# Patient Record
Sex: Female | Born: 1975 | Hispanic: No | Marital: Married | Smoking: Never smoker
Health system: Southern US, Community
[De-identification: ages and names within clinical notes are randomized; demographics above are authoritative.]

## PROBLEM LIST (undated history)

## (undated) DIAGNOSIS — D649 Anemia, unspecified: Secondary | ICD-10-CM

## (undated) DIAGNOSIS — E119 Type 2 diabetes mellitus without complications: Secondary | ICD-10-CM

## (undated) DIAGNOSIS — D219 Benign neoplasm of connective and other soft tissue, unspecified: Secondary | ICD-10-CM

## (undated) DIAGNOSIS — O24419 Gestational diabetes mellitus in pregnancy, unspecified control: Secondary | ICD-10-CM

## (undated) HISTORY — DX: Benign neoplasm of connective and other soft tissue, unspecified: D21.9

## (undated) HISTORY — DX: Gestational diabetes mellitus in pregnancy, unspecified control: O24.419

## (undated) HISTORY — PX: MYOMECTOMY ABDOMINAL APPROACH: SUR870

## (undated) HISTORY — DX: Anemia, unspecified: D64.9

## (undated) HISTORY — PX: EYE SURGERY: SHX253

---

## 2018-08-03 ENCOUNTER — Ambulatory Visit
Admission: EM | Admit: 2018-08-03 | Discharge: 2018-08-03 | Disposition: A | Discharge status: Home / Self Care | Payer: Medicaid Other | Attending: Family Medicine | Admitting: Family Medicine

## 2018-08-03 ENCOUNTER — Other Ambulatory Visit: Payer: Self-pay

## 2018-08-03 DIAGNOSIS — R202 Paresthesia of skin: Secondary | ICD-10-CM | POA: Diagnosis present

## 2018-08-03 DIAGNOSIS — R519 Headache, unspecified: Secondary | ICD-10-CM

## 2018-08-03 DIAGNOSIS — R51 Headache: Secondary | ICD-10-CM | POA: Insufficient documentation

## 2018-08-03 LAB — CBC WITH DIFFERENTIAL/PLATELET
Abs Immature Granulocytes: 0.01 10*3/uL (ref 0.00–0.07)
Basophils Absolute: 0 10*3/uL (ref 0.0–0.1)
Basophils Relative: 1 %
Eosinophils Absolute: 0 10*3/uL (ref 0.0–0.5)
Eosinophils Relative: 1 %
HCT: 38.6 % (ref 36.0–46.0)
Hemoglobin: 13.4 g/dL (ref 12.0–15.0)
Immature Granulocytes: 0 %
Lymphocytes Relative: 47 %
Lymphs Abs: 2 10*3/uL (ref 0.7–4.0)
MCH: 29 pg (ref 26.0–34.0)
MCHC: 34.7 g/dL (ref 30.0–36.0)
MCV: 83.5 fL (ref 80.0–100.0)
Monocytes Absolute: 0.3 10*3/uL (ref 0.1–1.0)
Monocytes Relative: 8 %
Neutro Abs: 1.8 10*3/uL (ref 1.7–7.7)
Neutrophils Relative %: 43 %
Platelets: 210 10*3/uL (ref 150–400)
RBC: 4.62 MIL/uL (ref 3.87–5.11)
RDW: 12.5 % (ref 11.5–15.5)
WBC: 4.2 10*3/uL (ref 4.0–10.5)
nRBC: 0 % (ref 0.0–0.2)

## 2018-08-03 LAB — BASIC METABOLIC PANEL
Anion gap: 6 (ref 5–15)
BUN: 13 mg/dL (ref 6–20)
CO2: 25 mmol/L (ref 22–32)
Calcium: 9.1 mg/dL (ref 8.9–10.3)
Chloride: 105 mmol/L (ref 98–111)
Creatinine, Ser: 0.45 mg/dL (ref 0.44–1.00)
GFR calc Af Amer: >60 mL/min (ref >60)
GFR calc non Af Amer: >60 mL/min (ref >60)
Glucose, Bld: 102 mg/dL — ABNORMAL HIGH (ref 70–99)
Potassium: 3.5 mmol/L (ref 3.5–5.1)
Sodium: 136 mmol/L (ref 135–145)

## 2018-08-03 NOTE — Discharge Instructions (Signed)
Follow up with Primary Care provider 

## 2018-08-03 NOTE — ED Triage Notes (Signed)
Patient complains of headaches, blurry vision and tingling in extremities x 2 weeks.

## 2018-08-03 NOTE — ED Provider Notes (Signed)
[facility]    CSN: [id number] Arrival date & time: 08/03/18  1518     History   Chief Complaint Chief Complaint  Patient presents with  . Headache    HPI Wendy [name] is a 43 y.o. female.   43 yo female with a c/o intermittent headaches, occasional blurred vision and occasional tingling of her extremities (both hands and feet) for the past 2 weeks. Patient currently not having the symptoms as they are intermittent. 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 States she doesn't check her blood sugars and has not had regular follow up with a PCP in over 1 year. Denies chest pain, shortness of breath, one-sided weakness, fevers, chills.    Headache    History reviewed. No pertinent past medical history.  There are no active problems to display for this patient.   Past Surgical History:  Procedure Laterality Date  . CESAREAN SECTION    . EYE SURGERY Bilateral     OB History   No obstetric history on file.      Home Medications    Prior to Admission medications   Medication Sig Start Date End Date Taking? Authorizing Provider  Multiple Vitamins-Minerals (MULTIVITAMIN WITH MINERALS) tablet Take 1 tablet by mouth daily.   Yes [provider]    Family History Family History  Problem Relation Age of Onset  . Diabetes Mother     Social History Social History   Tobacco Use  . Smoking status: Never Smoker  . Smokeless tobacco: Never Used  Substance Use Topics  . Alcohol use: Never    Frequency: Never  . Drug use: Never     Allergies   Patient has no known allergies.   Review of Systems Review of Systems  Neurological: Positive for headaches.     Physical Exam Triage Vital Signs ED Triage Vitals  Enc Vitals Group     BP 08/03/18 1536 105/75     Pulse Rate 08/03/18 1536 76     Resp 08/03/18 1536 16     Temp 08/03/18 1536 99.1 F (37.3 C)     Temp Source  08/03/18 1536 Oral     SpO2 08/03/18 1536 98 %     Weight 08/03/18 1534 105 lb (47.6 kg)     Height 08/03/18 1534 5\' 6"  (1.676 m)     Head Circumference --      Peak Flow --      Pain Score 08/03/18 1534 0     Pain Loc --      Pain Edu? --      Excl. in [place]? --    No data found.  Updated Vital Signs BP 105/75 (BP Location: Right Arm)   Pulse 76   Temp 99.1 F (37.3 C) (Oral)   Resp 16   Ht 5\' 6"  (1.676 m)   Wt 47.6 kg   LMP 07/14/2018   SpO2 98%   BMI 16.95 kg/m   Visual Acuity Right Eye Distance: 20/20(corrected) Left Eye Distance: 20/25(corrected) Bilateral Distance: 20/20(corrected)  Right Eye Near:   Left Eye Near:    Bilateral Near:     Physical Exam Vitals signs and nursing note reviewed.  Constitutional:      General: She is not in acute distress.    Appearance: She is not toxic-appearing or diaphoretic.  Neck:     Musculoskeletal: Neck supple.  Neurological:  General: No focal deficit present.     Mental Status: She is alert and oriented to person, place, and time.     Cranial Nerves: No cranial nerve deficit.      UC Treatments / Results  Labs (all labs ordered are listed, but only abnormal results are displayed) Labs Reviewed  BASIC METABOLIC PANEL - Abnormal; Notable for the following components:      Result Value   Glucose, Bld 102 (*)    All other components within normal limits  CBC WITH DIFFERENTIAL/PLATELET    EKG None  Radiology No results found.  Procedures Procedures (including critical care time)  Medications Ordered in UC Medications - No data to display  Initial Impression / Assessment and Plan / UC Course  I have reviewed the triage vital signs and the nursing notes.  Pertinent labs & imaging results that were available during my care of the patient were reviewed by me and considered in my medical decision making (see chart for details).      Final Clinical Impressions(s) / UC Diagnoses   Final diagnoses:   Frequent headaches  Paresthesias     Discharge Instructions     Follow up with Primary Care provider    ED Prescriptions    None     1. Lab results and diagnosis reviewed with patient 2. rx as per orders above; reviewed possible side effects, interactions, risks and benefits  3. Recommend supportive treatment with  4. Follow-up prn if symptoms worsen or don't improve Controlled Substance Prescriptions [place] Controlled Substance Registry consulted? Not Applicable   Norval Gable, MD 08/03/18 [phone]

## 2018-11-30 ENCOUNTER — Other Ambulatory Visit: Payer: Self-pay

## 2018-11-30 ENCOUNTER — Ambulatory Visit: Payer: Medicaid Other

## 2018-11-30 ENCOUNTER — Ambulatory Visit
Admission: EM | Admit: 2018-11-30 | Discharge: 2018-11-30 | Disposition: A | Discharge status: Home / Self Care | Payer: Medicaid Other | Attending: Family Medicine | Admitting: Family Medicine

## 2018-11-30 ENCOUNTER — Encounter: Payer: Self-pay | Admitting: Emergency Medicine

## 2018-11-30 DIAGNOSIS — L720 Epidermal cyst: Secondary | ICD-10-CM | POA: Insufficient documentation

## 2018-11-30 MED ORDER — MUPIROCIN 2 % EX OINT: 1.0000 "application " | TOPICAL_OINTMENT | Freq: Three times a day (TID) | CUTANEOUS | 0 refills | Status: DC

## 2018-11-30 NOTE — Discharge Instructions (Signed)
Use mupirocin to prevent secondary infection.  Recommend following up with hand surgery possible excision.

## 2018-11-30 NOTE — ED Provider Notes (Signed)
[facility]    CSN: [id number] Arrival date & time: 11/30/18  1735      History   Chief Complaint Chief Complaint  Patient presents with  . knot    right 4th finger    HPI Wendy [name] is a 43 y.o. female.   HPI  43 year old female presents with a tender small lesion over the volar aspect of her right dominant fourth finger at the ulnar base of her middle phalanx.  She relates that about a month ago she had a splinter embedded in the area from a cabinet in her home.  She states she remove the splinter,she thought totally, and seemed to improve until last weekend she noticed a raised area that is very tender to the touch.  States she is been able to express some fluid but does not appear to be any pus present.  The area does not appear infected.  She has had no fever or chills.       History reviewed. No pertinent past medical history.  There are no active problems to display for this patient.   Past Surgical History:  Procedure Laterality Date  . CESAREAN SECTION    . EYE SURGERY Bilateral     OB History   No obstetric history on file.      Home Medications    Prior to Admission medications   Medication Sig Start Date End Date Taking? Authorizing Provider  Multiple Vitamins-Minerals (MULTIVITAMIN WITH MINERALS) tablet Take 1 tablet by mouth daily.   Yes [provider]  mupirocin ointment (BACTROBAN) 2 % Apply 1 application topically 3 (three) times daily. 11/30/18   Lorin Picket, PA-C    Family History Family History  Problem Relation Age of Onset  . Diabetes Mother     Social History Social History   Tobacco Use  . Smoking status: Never Smoker  . Smokeless tobacco: Never Used  Substance Use Topics  . Alcohol use: Never    Frequency: Never  . Drug use: Never     Allergies   Patient has no known allergies.   Review of Systems Review of Systems  Constitutional: Negative for activity change, appetite change, chills,  diaphoresis, fatigue and fever.  Skin: Positive for wound.  All other systems reviewed and are negative.    Physical Exam Triage Vital Signs ED Triage Vitals  Enc Vitals Group     BP 11/30/18 1746 108/74     Pulse Rate 11/30/18 1746 73     Resp 11/30/18 1746 14     Temp 11/30/18 1746 98.4 F (36.9 C)     Temp Source 11/30/18 1746 Oral     SpO2 11/30/18 1746 98 %     Weight 11/30/18 1744 100 lb (45.4 kg)     Height 11/30/18 1744 5\' 6"  (1.676 m)     Head Circumference --      Peak Flow --      Pain Score 11/30/18 1744 7     Pain Loc --      Pain Edu? --      Excl. in [place]? --    No data found.  Updated Vital Signs BP 108/74 (BP Location: Left Arm)   Pulse 73   Temp 98.4 F (36.9 C) (Oral)   Resp 14   Ht 5\' 6"  (1.676 m)   Wt 100 lb (45.4 kg)   LMP 11/26/2018 (Exact Date)   SpO2 98%   BMI 16.14 kg/m   Visual Acuity Right  Eye Distance:   Left Eye Distance:   Bilateral Distance:    Right Eye Near:   Left Eye Near:    Bilateral Near:     Physical Exam Vitals signs and nursing note reviewed.  Constitutional:      General: She is not in acute distress.    Appearance: Normal appearance. She is normal weight. She is not ill-appearing, toxic-appearing or diaphoretic.  HENT:     Head: Normocephalic.     Nose: Nose normal.  Eyes:     Conjunctiva/sclera: Conjunctivae normal.     Pupils: Pupils are equal, round, and reactive to light.  Neck:     Musculoskeletal: Normal range of motion and neck supple.  Cardiovascular:     Rate and Rhythm: Normal rate and regular rhythm.  Pulmonary:     Effort: Pulmonary effort is normal.     Breath sounds: Normal breath sounds.  Musculoskeletal: Normal range of motion.        General: Tenderness present.     Comments: Examination of the right fourth dominant finger shows a small 3 mm in diameter circular hard callus type lesion with a central punctate area.  No erythema.  The lesion is tender to the touch.  Induration extends  another 1 to 2 mm around the darkened cornified lesion.  Nothing it was able to be expressed from the lesion.  Skin:    General: Skin is warm and dry.     Findings: Lesion present.  Neurological:     General: No focal deficit present.     Mental Status: She is alert and oriented to person, place, and time.  Psychiatric:        Mood and Affect: Mood normal.        Behavior: Behavior normal.      UC Treatments / Results  Labs (all labs ordered are listed, but only abnormal results are displayed) Labs Reviewed - No data to display  EKG   Radiology Dg Finger Ring Right  Result Date: 11/30/2018 CLINICAL DATA:  Foreign body, splinter near PIP joint EXAM: RIGHT RING FINGER 2+V COMPARISON:  None. FINDINGS: No fracture or dislocation of the right fourth digit. Joint spaces are well preserved. There is no radiopaque foreign body visible. Soft tissue edema of the volar fourth digit overlying the middle phalanx. IMPRESSION: No fracture or dislocation of the right fourth digit. Joint spaces are well preserved. There is no radiopaque foreign body visible. Soft tissue edema of the volar fourth digit overlying the middle phalanx. Please note that organic foreign foreign bodies such as wood splinters are typically not radiopaque. Electronically Signed   By: Eddie Candle M.D.   On: 11/30/2018 18:27    Procedures Foreign Body Removal  Date/Time: 11/30/2018 7:00 PM Performed by: Lorin Picket, PA-C Authorized by: Coral Spikes, DO   Consent:    Consent obtained:  Verbal   Consent given by:  Patient   Risks discussed:  Infection and incomplete removal Location:    Location:  Finger   Finger location:  R ring finger   Depth:  Intradermal   Tendon involvement:  None Pre-procedure details:    Imaging:  X-ray   Neurovascular status: intact   Anesthesia (see MAR for exact dosages):    Anesthesia method:  None Procedure type:    Procedure complexity:  Simple Procedure details:     Dissection of underlying tissues: no     Bloodless field: no     Foreign bodies recovered:  None   Intact foreign body removal: no   Post-procedure details:    Neurovascular status: intact     Confirmation:  No additional foreign bodies on visualization   Skin closure:  None   Dressing:  Antibiotic ointment   Patient tolerance of procedure:  Tolerated well, no immediate complications Comments:     18-gauge needle was used to tease and probe the punctate area.  No additional foreign body was identified.  She tolerated the procedure well.  Will refer to hand surgery for further evaluation and possible excision.  She was given mupirocin ointment to apply 3 times daily to prevent secondary infection.   (including critical care time)  Medications Ordered in UC Medications - No data to display  Initial Impression / Assessment and Plan / UC Course  I have reviewed the triage vital signs and the nursing notes.  Pertinent labs & imaging results that were available during my care of the patient were reviewed by me and considered in my medical decision making (see chart for details).   Patient likely has an inclusion cyst from previous, to the area from a splinter.  Did not reveal a foreign body this was likely from organic material and not likely radiopaque.  I therefore recommended that she follow-up with hand surgery for further evaluation and possible excision.  Patient was given a prescription for mupirocin to prevent secondary infection.  Currently in training at [facility] and will find it difficult to make an appointment in the near future.  I recommended that she consider a orthopedic urgent care where she may go after hours.   Final Clinical Impressions(s) / UC Diagnoses   Final diagnoses:  Epidermal inclusion cyst     Discharge Instructions     Use mupirocin to prevent secondary infection.  Recommend following up with hand surgery possible excision.    ED Prescriptions     Medication Sig Dispense Auth. Provider   mupirocin ointment (BACTROBAN) 2 % Apply 1 application topically 3 (three) times daily. 22 g Lorin Picket, PA-C     PDMP not reviewed this encounter.   Lorin Picket, PA-C 11/30/18 1907

## 2018-11-30 NOTE — ED Triage Notes (Signed)
Patient c/o tender bump on her right 4th finger for the past week.  Patient states that she had cut the same finger about a month ago.

## 2019-07-06 ENCOUNTER — Ambulatory Visit: Payer: Medicaid Other | Attending: Internal Medicine

## 2019-07-06 DIAGNOSIS — Z23 Encounter for immunization: Secondary | ICD-10-CM

## 2019-07-06 NOTE — Progress Notes (Signed)
   Covid-19 Vaccination Clinic  Name:  Wendy [name]    MRN: [id number] DOB: [birth year]  07/06/2019  [name] was observed post Covid-19 immunization for 15 minutes without incident. She was provided with Vaccine Information Sheet and instruction to access the V-Safe system.   [name] was instructed to call 911 with any severe reactions post vaccine: Marland Kitchen Difficulty breathing  . Swelling of face and throat  . A fast heartbeat  . A bad rash all over body  . Dizziness and weakness   Immunizations Administered    Name Date Dose VIS Date Route   Pfizer COVID-19 Vaccine 07/06/2019  9:34 AM 0.3 mL 05/08/2018 Intramuscular   Manufacturer: [place]   Lot: [id number]   [place]: [id number]

## 2019-07-30 ENCOUNTER — Ambulatory Visit: Payer: Medicaid Other

## 2019-08-06 ENCOUNTER — Other Ambulatory Visit: Payer: Self-pay

## 2019-08-06 ENCOUNTER — Emergency Department
Admission: EM | Admit: 2019-08-06 | Discharge: 2019-08-06 | Disposition: A | Discharge status: Home / Self Care | Payer: Managed Care, Other (non HMO) | Attending: Emergency Medicine | Admitting: Emergency Medicine

## 2019-08-06 ENCOUNTER — Encounter: Payer: Self-pay | Admitting: Emergency Medicine

## 2019-08-06 ENCOUNTER — Emergency Department: Payer: Managed Care, Other (non HMO)

## 2019-08-06 DIAGNOSIS — N939 Abnormal uterine and vaginal bleeding, unspecified: Secondary | ICD-10-CM

## 2019-08-06 DIAGNOSIS — O209 Hemorrhage in early pregnancy, unspecified: Secondary | ICD-10-CM | POA: Insufficient documentation

## 2019-08-06 DIAGNOSIS — R1030 Lower abdominal pain, unspecified: Secondary | ICD-10-CM | POA: Insufficient documentation

## 2019-08-06 DIAGNOSIS — Z3A01 Less than 8 weeks gestation of pregnancy: Secondary | ICD-10-CM | POA: Insufficient documentation

## 2019-08-06 DIAGNOSIS — O200 Threatened abortion: Secondary | ICD-10-CM | POA: Insufficient documentation

## 2019-08-06 DIAGNOSIS — O4690 Antepartum hemorrhage, unspecified, unspecified trimester: Secondary | ICD-10-CM

## 2019-08-06 LAB — CBC
HCT: 37.6 % (ref 36.0–46.0)
Hemoglobin: 12.4 g/dL (ref 12.0–15.0)
MCH: 27.9 pg (ref 26.0–34.0)
MCHC: 33 g/dL (ref 30.0–36.0)
MCV: 84.7 fL (ref 80.0–100.0)
Platelets: 229 10*3/uL (ref 150–400)
RBC: 4.44 MIL/uL (ref 3.87–5.11)
RDW: 12.8 % (ref 11.5–15.5)
WBC: 6 10*3/uL (ref 4.0–10.5)
nRBC: 0 % (ref 0.0–0.2)

## 2019-08-06 LAB — HCG, QUANTITATIVE, PREGNANCY: hCG, Beta Chain, Quant, S: 24496 m[IU]/mL — ABNORMAL HIGH (ref <5)

## 2019-08-06 NOTE — ED Notes (Signed)
US at bedside

## 2019-08-06 NOTE — ED Triage Notes (Signed)
Pt reports she is approx. [redacted] weeks pregnant and noticed cramping on Monday and today passed 1 blood clot and has had vaginal bleeding since. Pt has not seen OB for this pregnancy due to recent move. Pt has had previous miscarriage in past.

## 2019-08-06 NOTE — Discharge Instructions (Addendum)
You should return to the ED in 10-14 days for repeat ultrasound. Return sooner if needed.

## 2019-08-06 NOTE — ED Provider Notes (Signed)
[facility] Emergency Department Provider Note ____________________________________________  Time seen: 2100  I have reviewed the triage vital signs and the nursing notes.  HISTORY  Chief Complaint  Vaginal Bleeding  HPI Mina Batdorf is a 44 y.o. female presents herself to the ED at [redacted] weeks gestation, with noted cramping and vaginal bleeding x1 day.  Patient describes  onset of abdominal cramping on Monday.  She has not establish care with a local primary OB provider since relocating to the area.  She does give a history of previous miscarriage.  Patient denies any fevers, chills, sweats, nausea, vomiting, or dizziness.  Patient also denies any chest pain, shortness of breath, or weakness.   History reviewed. No pertinent past medical history.  There are no problems to display for this patient.   Past Surgical History:  Procedure Laterality Date  . CESAREAN SECTION    . EYE SURGERY Bilateral     Prior to Admission medications   Medication Sig Start Date End Date Taking? Authorizing Provider  Multiple Vitamins-Minerals (MULTIVITAMIN WITH MINERALS) tablet Take 1 tablet by mouth daily.    [provider]  mupirocin ointment (BACTROBAN) 2 % Apply 1 application topically 3 (three) times daily. 11/30/18   Lorin Picket, PA-C    Allergies Patient has no known allergies.  Family History  Problem Relation Age of Onset  . Diabetes Mother     Social History Social History   Tobacco Use  . Smoking status: Never Smoker  . Smokeless tobacco: Never Used  Substance Use Topics  . Alcohol use: Never  . Drug use: Never    Review of Systems  Constitutional: Negative for fever. Eyes: Negative for visual changes. ENT: Negative for sore throat. Cardiovascular: Negative for chest pain. Respiratory: Negative for shortness of breath. Gastrointestinal: Negative for abdominal pain, vomiting and diarrhea. Genitourinary: Negative for dysuria. Reports  vaginal bleeding Musculoskeletal: Negative for back pain. Skin: Negative for rash. Neurological: Negative for headaches, focal weakness or numbness. ____________________________________________  PHYSICAL EXAM:  VITAL SIGNS: ED Triage Vitals [08/06/19 1923]  Enc Vitals Group     BP 111/71     Pulse Rate 70     Resp 18     Temp 98.6 F (37 C)     Temp Source Oral     SpO2 100 %     Weight 99 lb (44.9 kg)     Height 5\' 6"  (1.676 m)     Head Circumference      Peak Flow      Pain Score      Pain Loc      Pain Edu?      Excl. in [place]?     Constitutional: Alert and oriented. Well appearing and in no distress. Head: Normocephalic and atraumatic. Eyes: Conjunctivae are normal. Normal extraocular movements Cardiovascular: Normal rate, regular rhythm. Normal distal pulses. Respiratory: Normal respiratory effort. No wheezes/rales/rhonchi. Gastrointestinal: Soft and nontender. No distention. GU: normal external genitalia. Minimal dark blood and small clots in the vault. No CMT or adnexal masses appreciated. Musculoskeletal: Nontender with normal range of motion in all extremities.  Neurologic:  Normal gait without ataxia. Normal speech and language. No gross focal neurologic deficits are appreciated. Skin:  Skin is warm, dry and intact. No rash noted. Psychiatric: Mood and affect are normal. Patient exhibits appropriate insight and judgment. ____________________________________________   LABS (pertinent positives/negatives) Labs Reviewed  WET PREP, GENITAL - Abnormal; Notable for the following components:      Result Value  WBC, Wet Prep HPF POC MODERATE (*)    All other components within normal limits  HCG, QUANTITATIVE, PREGNANCY - Abnormal; Notable for the following components:   hCG, Beta Chain, Quant, S 24,496 (*)    All other components within normal limits  CHLAMYDIA/NGC RT PCR ([facility] ONLY)  CBC  ____________________________________________    RADIOLOGY  OB/Transvaginal US <14 weeks  IMPRESSION: 1. Irregular fluid collection within the uterus presumably representing gestational sac. Mean sac diameter of 18 mm but no yolk sac or embryo. Findings are suspicious but not yet definitive for failed pregnancy. Recommend follow-up US in 10-14 days for definitive diagnosis. This recommendation follows SRU consensus guidelines: Diagnostic Criteria for Nonviable Pregnancy Early in the First Trimester. [place] [place] Med 2013; [id number]. ____________________________________________  PROCEDURES  Procedures ____________________________________________  INITIAL IMPRESSION / ASSESSMENT AND PLAN / ED COURSE  DDX: threatened AB, spontaneous AB, normal IUP, ectopic pregnancy, subchorionic hemorrhage  Female patient with ED evaluation of recent pregnancy test and recent onset of vaginal bleeding.  Patient with a history of multiple miscarriages, presents for concern over possible failed pregnancy.  Labs are reassuring at this time.  Ultrasound does reveal a gestational sac, but no visible fetal pole or cardiac tibias appreciated.  The ultrasound findings are concerning for a failed pregnancy versus an early undefined pregnancy at this time.  Patient is discharged with instructions to return to the ED in 10 to 14 days, for repeat ultrasound.  She is also advised to establish care with a local OB provider in the interim.  Return precautions have been reviewed.  Toshika Piercey was evaluated in Emergency Department on 08/07/2019 for the symptoms described in the history of present illness. She was evaluated in the context of the global COVID-19 pandemic, which necessitated consideration that the patient might be at risk for infection with the SARS-CoV-2 virus that causes COVID-19. Institutional protocols and algorithms that pertain to the evaluation of patients at risk for COVID-19 are in a state of rapid change based on information released by regulatory  bodies including the CDC and federal and state organizations. These policies and algorithms were followed during the patient's care in the ED. ____________________________________________  FINAL CLINICAL IMPRESSION(S) / ED DIAGNOSES  Final diagnoses:  Vaginal bleeding  Threatened miscarriage      Melvenia Needles, PA-C 08/07/19 1544    Blake Divine, MD 08/09/19 [phone]

## 2019-08-07 LAB — WET PREP, GENITAL
Clue Cells Wet Prep HPF POC: NONE SEEN
Sperm: NONE SEEN
Trich, Wet Prep: NONE SEEN
Yeast Wet Prep HPF POC: NONE SEEN

## 2019-08-07 LAB — CHLAMYDIA/NGC RT PCR (ARMC ONLY)
Chlamydia Tr: NOT DETECTED
N gonorrhoeae: NOT DETECTED

## 2019-08-11 ENCOUNTER — Other Ambulatory Visit: Payer: Self-pay

## 2019-08-11 ENCOUNTER — Emergency Department
Admission: EM | Admit: 2019-08-11 | Discharge: 2019-08-11 | Disposition: A | Discharge status: Home / Self Care | Payer: Managed Care, Other (non HMO) | Attending: Obstetrics & Gynecology | Admitting: Obstetrics & Gynecology

## 2019-08-11 ENCOUNTER — Other Ambulatory Visit: Payer: Self-pay | Admitting: Obstetrics & Gynecology

## 2019-08-11 ENCOUNTER — Emergency Department: Payer: Managed Care, Other (non HMO)

## 2019-08-11 DIAGNOSIS — O034 Incomplete spontaneous abortion without complication: Secondary | ICD-10-CM | POA: Insufficient documentation

## 2019-08-11 DIAGNOSIS — R102 Pelvic and perineal pain: Secondary | ICD-10-CM

## 2019-08-11 DIAGNOSIS — Z20822 Contact with and (suspected) exposure to covid-19: Secondary | ICD-10-CM | POA: Diagnosis not present

## 2019-08-11 DIAGNOSIS — Z79899 Other long term (current) drug therapy: Secondary | ICD-10-CM | POA: Diagnosis not present

## 2019-08-11 DIAGNOSIS — O209 Hemorrhage in early pregnancy, unspecified: Secondary | ICD-10-CM | POA: Diagnosis present

## 2019-08-11 DIAGNOSIS — Z3A01 Less than 8 weeks gestation of pregnancy: Secondary | ICD-10-CM | POA: Diagnosis not present

## 2019-08-11 LAB — CBC WITH DIFFERENTIAL/PLATELET
Abs Immature Granulocytes: 0.04 10*3/uL (ref 0.00–0.07)
Basophils Absolute: 0 10*3/uL (ref 0.0–0.1)
Basophils Relative: 0 %
Eosinophils Absolute: 0 10*3/uL (ref 0.0–0.5)
Eosinophils Relative: 0 %
HCT: 32.2 % — ABNORMAL LOW (ref 36.0–46.0)
Hemoglobin: 11.1 g/dL — ABNORMAL LOW (ref 12.0–15.0)
Immature Granulocytes: 1 %
Lymphocytes Relative: 11 %
Lymphs Abs: 0.9 10*3/uL (ref 0.7–4.0)
MCH: 28.4 pg (ref 26.0–34.0)
MCHC: 34.5 g/dL (ref 30.0–36.0)
MCV: 82.4 fL (ref 80.0–100.0)
Monocytes Absolute: 0.5 10*3/uL (ref 0.1–1.0)
Monocytes Relative: 6 %
Neutro Abs: 6.6 10*3/uL (ref 1.7–7.7)
Neutrophils Relative %: 82 %
Platelets: 197 10*3/uL (ref 150–400)
RBC: 3.91 MIL/uL (ref 3.87–5.11)
RDW: 12.8 % (ref 11.5–15.5)
WBC: 8.1 10*3/uL (ref 4.0–10.5)
nRBC: 0 % (ref 0.0–0.2)

## 2019-08-11 LAB — BASIC METABOLIC PANEL
Anion gap: 6 (ref 5–15)
BUN: 11 mg/dL (ref 6–20)
CO2: 26 mmol/L (ref 22–32)
Calcium: 8.6 mg/dL — ABNORMAL LOW (ref 8.9–10.3)
Chloride: 108 mmol/L (ref 98–111)
Creatinine, Ser: 0.41 mg/dL — ABNORMAL LOW (ref 0.44–1.00)
GFR calc Af Amer: >60 mL/min (ref >60)
GFR calc non Af Amer: >60 mL/min (ref >60)
Glucose, Bld: 119 mg/dL — ABNORMAL HIGH (ref 70–99)
Potassium: 3.7 mmol/L (ref 3.5–5.1)
Sodium: 140 mmol/L (ref 135–145)

## 2019-08-11 LAB — ABO/RH: ABO/RH(D): A POS

## 2019-08-11 LAB — HCG, QUANTITATIVE, PREGNANCY: hCG, Beta Chain, Quant, S: 9711 m[IU]/mL — ABNORMAL HIGH (ref <5)

## 2019-08-11 LAB — SARS CORONAVIRUS 2 BY RT PCR (HOSPITAL ORDER, PERFORMED IN ~~LOC~~ HOSPITAL LAB): SARS Coronavirus 2: NEGATIVE

## 2019-08-11 MED ORDER — IBUPROFEN 600 MG PO TABS
600.0000 mg | ORAL_TABLET | Freq: Four times a day (QID) | ORAL | 3 refills | Status: DC | PRN
Start: 2019-08-11 — End: 2019-12-17

## 2019-08-11 MED ORDER — METHYLERGONOVINE MALEATE 0.2 MG PO TABS
0.2000 mg | ORAL_TABLET | Freq: Four times a day (QID) | ORAL | Status: DC
  Administered 2019-08-11: 0.2 mg via ORAL
  Filled 2019-08-11 (×3): qty 1

## 2019-08-11 MED ORDER — LACTATED RINGERS IV BOLUS
1000.0000 mL | Freq: Once | INTRAVENOUS | Status: AC
  Administered 2019-08-11: 1000 mL via INTRAVENOUS

## 2019-08-11 MED ORDER — DOXYCYCLINE HYCLATE 100 MG PO CAPS
100.0000 mg | ORAL_CAPSULE | Freq: Two times a day (BID) | ORAL | 0 refills | Status: DC
Start: 2019-08-11 — End: 2019-08-23

## 2019-08-11 MED ORDER — IBUPROFEN 400 MG PO TABS
400.0000 mg | ORAL_TABLET | Freq: Four times a day (QID) | ORAL | Status: DC | PRN
  Administered 2019-08-11: 400 mg via ORAL
  Filled 2019-08-11: qty 1

## 2019-08-11 MED ORDER — METHYLERGONOVINE MALEATE 0.2 MG PO TABS
0.2000 mg | ORAL_TABLET | Freq: Four times a day (QID) | ORAL | 0 refills | Status: DC
Start: 2019-08-11 — End: 2019-08-23

## 2019-08-11 MED ORDER — DOXYCYCLINE HYCLATE 100 MG PO TABS
100.0000 mg | ORAL_TABLET | Freq: Two times a day (BID) | ORAL | Status: DC
  Administered 2019-08-11: 100 mg via ORAL
  Filled 2019-08-11: qty 1

## 2019-08-11 MED ORDER — MORPHINE SULFATE (PF) 4 MG/ML IV SOLN
4.0000 mg | Freq: Once | INTRAVENOUS | Status: AC
  Administered 2019-08-11: 4 mg via INTRAVENOUS
  Filled 2019-08-11: qty 1

## 2019-08-11 NOTE — ED Triage Notes (Signed)
Pt arrives via EMS from home for vaginal bleeding after recently being told she was pregnant from a urine test at her doctors- pt states bleeding and cramping has been going on for about a week- pt has soaked 3-4 pads today and has noticed multiple clots

## 2019-08-11 NOTE — ED Notes (Signed)
Dr Charna Archer at bedside to do pelvic and states there is too much blood to do wet prep and GC/chlamydia swab

## 2019-08-11 NOTE — ED Notes (Signed)
Pt transported for US

## 2019-08-11 NOTE — Consult Note (Signed)
Obstetrics & Gynecology History and Physical Note  Date of Consultation: 08/11/2019   Requesting Provider: [facility] ER  Primary OBGYN: none Primary Care Provider: Patient, No Pcp Per  CC/ Reason for Consultation: Bleeding in early pregnancy  History of Present Illness: Wendy [name] is a 44 y.o. G3P1011 AAF (Patient's last menstrual period was 05/27/2019.), with the above CC. She noted bleeding several days ago and was seen with beta hCG 24,000 and [place] c/w fetal demise.  Worse bleeding yesterday and today with severe crampy lower abdominal pain without radiation and no modifiers or associated sx's.  Beta m=now 9000.  [place] with gest sac in LUS.  ROS: A review of systems was performed and was complete and comprehensive, except as stated in the above HPI.  OBGYN History: As per HPI. OB History    Gravida  1   Para      Term      Preterm      AB      Living        SAB      TAB      Ectopic      Multiple      Live Births               Past Medical History: History reviewed. No pertinent past medical history.  H/o FIBROIDS Prior Myomectomy  Past Surgical History: Past Surgical History:  Procedure Laterality Date  . CESAREAN SECTION    . EYE SURGERY Bilateral     Family History:  Family History  Problem Relation Age of Onset  . Diabetes Mother    She denies any female cancers, bleeding or blood clotting disorders.   Social History:  Social History   Socioeconomic History  . Marital status: Married    Spouse name: Not on file  . Number of children: Not on file  . Years of education: Not on file  . Highest education level: Not on file  Occupational History  . Not on file  Tobacco Use  . Smoking status: Never Smoker  . Smokeless tobacco: Never Used  Substance and Sexual Activity  . Alcohol use: Never  . Drug use: Never  . Sexual activity: Not on file  Other Topics Concern  . Not on file  Social History Narrative  . Not on file   Social Determinants of  Health   Financial Resource Strain:   . Difficulty of Paying Living Expenses:   Food Insecurity:   . Worried About Charity fundraiser in the Last Year:   . Arboriculturist in the Last Year:   Transportation Needs:   . Film/video editor (Medical):   Marland Kitchen Lack of Transportation (Non-Medical):   Physical Activity:   . Days of Exercise per Week:   . Minutes of Exercise per Session:   Stress:   . Feeling of Stress :   Social Connections:   . Frequency of Communication with Friends and Family:   . Frequency of Social Gatherings with Friends and Family:   . Attends Religious Services:   . Active Member of Clubs or Organizations:   . Attends Archivist Meetings:   Marland Kitchen Marital Status:   Intimate Partner Violence:   . Fear of Current or Ex-Partner:   . Emotionally Abused:   Marland Kitchen Physically Abused:   . Sexually Abused:     Allergy: No Known Allergies  Current Outpatient Medications: (Not in a hospital admission)   Hospital Medications: Current Facility-Administered Medications  Medication Dose Route Frequency Provider Last Rate Last Admin  . doxycycline (VIBRA-TABS) tablet 100 mg  100 mg Oral Q12H Gae Dry, MD      . ibuprofen (ADVIL) tablet 400 mg  400 mg Oral Q6H PRN Gae Dry, MD      . methylergonovine (METHERGINE) tablet 0.2 mg  0.2 mg Oral QID Gae Dry, MD       Current Outpatient Medications  Medication Sig Dispense Refill  . Multiple Vitamins-Minerals (MULTIVITAMIN WITH MINERALS) tablet Take 1 tablet by mouth daily.    . mupirocin ointment (BACTROBAN) 2 % Apply 1 application topically 3 (three) times daily. 22 g 0    Physical Exam: Vitals:   08/11/19 1200 08/11/19 1230 08/11/19 1300 08/11/19 1330  BP: 95/78 91/66 92/60  92/63  Pulse: 80 63 63 60  Resp:      SpO2: 99% 100% 100% 100%  Weight:      Height:        Pulse Rate:  [60-85] 60 (05/30 1330) Resp:  [18] 18 (05/30 1125) BP: (86-95)/(59-78) 92/63 (05/30 1330) SpO2:  [97  %-100 %] 100 % (05/30 1330) Weight:  [45 kg] 45 kg (05/30 1122) No intake/output data recorded. Total I/O In: 1000 [IV [id number] Out: -   Intake/Output Summary (Last 24 hours) at 08/11/2019 1355 Last data filed at 08/11/2019 1353 Gross per 24 hour  Intake 1000 ml  Output --  Net 1000 ml    Body mass index is 16.01 kg/m. Constitutional: Well nourished, well developed female in no acute distress.  HEENT: normal Neck:  Supple, normal appearance, and no thyromegaly  Cardiovascular:Regular rate and rhythm.  No murmurs, rubs or gallops. Respiratory:  Clear to auscultation bilateral. Normal respiratory effort Abdomen: positive bowel sounds and no masses, hernias; diffusely non tender to palpation, non distended Neuro: grossly intact Psych:  Normal mood and affect.  Skin:  Warm and dry.  MS: normal gait and normal bilateral lower extremity strength/ROM/symmetry Lymphatic:  No inguinal lymphadenopathy.   Pelvic exam: is not limited by body habitus EGBUS: within normal limits Vagina: within normal limits. Bladder and Urethra: normal. Cervix: somewhat dilated with clot at os Uterus:  enlarged, 8 weeks size Adnexa: no mass, fullness, tenderness  Laboratory: Beta HCG: 9000  Recent Labs  Lab 08/06/19 1926 08/11/19 1126  WBC 6.0 8.1  HGB 12.4 11.1*  HCT 37.6 32.2*  PLT 229 197   Recent Labs  Lab 08/11/19 1126  NA 140  K 3.7  CL 108  CO2 26  BUN 11  CREATININE 0.41*  CALCIUM 8.6*  GLUCOSE 119*   No results for input(s): APTT, INR, PTT in the last 168 hours.  Invalid input(s): DRHAPTT Recent Labs  Lab 08/11/19 1126  [place] Performed at [facility], [address], [place], [place] [address]     Imaging:  Ultrasound independently reviewed/interpreted by self.  Assessment: Wendy [name] is a 44 y.o. [phone] (Patient's last menstrual period was 05/27/2019.) who presented to the ED with complaints of pain and bleeding; findings are consistent with  Incomplete abortion.  Plan Exam in room.  Tissue forceps used to extract tissue thru cervix.  Seems intact and complete.  Min bleeding afterwards  Will monitor and provide Methergine for uterine effects.    Rx Methergine for 24 hours advised ABX for 3 days (Doxycyline) IBF for pain Tissue to pathology  F/U and contraception mgt as outpatient.  A total of 50 minutes were spent face-to-face with the patient  as well as preparation, review, communication, and documentation during this encounter.   Barnett Applebaum, MD, [facility], [facility] 08/11/2019  1:55 PM Pager [phone]

## 2019-08-11 NOTE — ED Provider Notes (Signed)
[facility] Emergency Department Provider Note   ____________________________________________   First MD Initiated Contact with Patient 08/11/19 1115     (approximate)  I have reviewed the triage vital signs and the nursing notes.   HISTORY  Chief Complaint Vaginal Bleeding    HPI Wendy [name] is a 44 y.o. female G3P1011 at approximately 8 weeks of pregnancy with no significant past medical history who presents to the ED complaining of abdominal pain and vaginal bleeding.  Patient reports she has been dealing with light vaginal bleeding for about 1 week, was initially evaluated for this in the ED 5 days ago.  At that time, ultrasound was concerning for failed pregnancy but bleeding was minimal and patient was set up for outpatient follow-up.  Patient states that last night she developed worsening bleeding and has started passing larger clots.  She has gone through 3 pads over the course of 4 hours this morning and has developed severe lower abdominal pain.  She describes the pain as cramping and coming in waves, only partially alleviated by fentanyl given by EMS.  She has schedule an appointment with OB/GYN, but has not yet seen them this pregnancy.        History reviewed. No pertinent past medical history.  Patient Active Problem List   Diagnosis Date Noted  . Vaginal bleeding affecting early pregnancy   . Pelvic pain     Past Surgical History:  Procedure Laterality Date  . CESAREAN SECTION    . EYE SURGERY Bilateral     Prior to Admission medications   Medication Sig Start Date End Date Taking? Authorizing Provider  doxycycline (VIBRAMYCIN) 100 MG capsule Take 1 capsule (100 mg total) by mouth 2 (two) times daily. 08/11/19   Gae Dry, MD  ibuprofen (ADVIL) 600 MG tablet Take 1 tablet (600 mg total) by mouth every 6 (six) hours as needed for moderate pain. 08/11/19   Gae Dry, MD  methylergonovine (METHERGINE) 0.2 MG tablet Take 1  tablet (0.2 mg total) by mouth 4 (four) times daily. For Four doses to minimize bleeding 08/11/19   Gae Dry, MD  Multiple Vitamins-Minerals (MULTIVITAMIN WITH MINERALS) tablet Take 1 tablet by mouth daily.    [provider]  mupirocin ointment (BACTROBAN) 2 % Apply 1 application topically 3 (three) times daily. 11/30/18   Lorin Picket, PA-C    Allergies Patient has no known allergies.  Family History  Problem Relation Age of Onset  . Diabetes Mother     Social History Social History   Tobacco Use  . Smoking status: Never Smoker  . Smokeless tobacco: Never Used  Substance Use Topics  . Alcohol use: Never  . Drug use: Never    Review of Systems  Constitutional: No fever/chills Eyes: No visual changes. ENT: No sore throat. Cardiovascular: Denies chest pain. Respiratory: Denies shortness of breath. Gastrointestinal: Positive for abdominal pain.  No nausea, no vomiting.  No diarrhea.  No constipation. Genitourinary: Negative for dysuria.  Positive for vaginal bleeding. Musculoskeletal: Negative for back pain. Skin: Negative for rash. Neurological: Negative for headaches, focal weakness or numbness.  ____________________________________________   PHYSICAL EXAM:  VITAL SIGNS: ED Triage Vitals  Enc Vitals Group     BP      Pulse      Resp      Temp      Temp src      SpO2      Weight  Height      Head Circumference      Peak Flow      Pain Score      Pain Loc      Pain Edu?      Excl. in [place]?     Constitutional: Alert and oriented. Eyes: Conjunctivae are normal. Head: Atraumatic. Nose: No congestion/rhinnorhea. Mouth/Throat: Mucous membranes are moist. Neck: Normal ROM Cardiovascular: Normal rate, regular rhythm. Grossly normal heart sounds. Respiratory: Normal respiratory effort.  No retractions. Lungs CTAB. Gastrointestinal: Soft and tender to palpation in bilateral lower quadrants with voluntary guarding. No  distention. Genitourinary: Moderate amount of vaginal bleeding, no tissue noted. Musculoskeletal: No lower extremity tenderness nor edema. Neurologic:  Normal speech and language. No gross focal neurologic deficits are appreciated. Skin:  Skin is warm, dry and intact. No rash noted. Psychiatric: Mood and affect are normal. Speech and behavior are normal.  ____________________________________________   LABS (all labs ordered are listed, but only abnormal results are displayed)  Labs Reviewed  CBC WITH DIFFERENTIAL/PLATELET - Abnormal; Notable for the following components:      Result Value   Hemoglobin 11.1 (*)    HCT 32.2 (*)    All other components within normal limits  BASIC METABOLIC PANEL - Abnormal; Notable for the following components:   Glucose, Bld 119 (*)    Creatinine, Ser 0.41 (*)    Calcium 8.6 (*)    All other components within normal limits  HCG, QUANTITATIVE, PREGNANCY - Abnormal; Notable for the following components:   hCG, Beta Chain, Quant, S 9,711 (*)    All other components within normal limits  SARS CORONAVIRUS 2 BY RT PCR (HOSPITAL ORDER, [facility])  URINALYSIS, COMPLETE (UACMP) WITH MICROSCOPIC  ABO/RH  SURGICAL PATHOLOGY     PROCEDURES  Procedure(s) performed (including Critical Care):  Procedures   ____________________________________________   INITIAL IMPRESSION / ASSESSMENT AND PLAN / ED COURSE       44 year old female, approximately [redacted] weeks pregnant and G3 P1-0-1-1 presents to the ED with vaginal bleeding for 1 week that worsened last night and has now been associated with severe lower abdominal cramping.  She is in significant pain upon arrival to the ED and this was not alleviated with fentanyl given by EMS, we will treat with IV morphine.  Pelvic exam shows significant bleeding, cervical os difficult to visualize given posterior positioning.  We will further assess with ultrasound and check labs, patient's Rh  status is unknown, will check this to determine need for RhoGam.  Her blood pressure is borderline low and we will start hydration with IV fluids.  Patient is Rh+, no indication for RhoGam.  Hemoglobin shows slight drop of 1-1/2 points.  She continues to have significant pain coming in waves.  Case discussed with Dr. Kenton Kingfisher of OB/GYN, who evaluated patient at the bedside and was able to clear her incomplete miscarriage.  He will start patient on antibiotics as well as Methergine, patient will be appropriate for discharge home with close OB follow-up if pain and bleeding are improved.  On reevaluation, patient continues to state that pain and bleeding are improved.  She was counseled to return to the ED for increasing pain or bleeding, otherwise follow-up with OB/GYN.  Patient agrees with plan.      ____________________________________________   FINAL CLINICAL IMPRESSION(S) / ED DIAGNOSES  Final diagnoses:  Pelvic pain  Incomplete abortion     ED Discharge Orders    None  Note:  This document was prepared using Dragon voice recognition software and may include unintentional dictation errors.   Blake Divine, MD 08/11/19 [phone]

## 2019-08-14 LAB — SURGICAL PATHOLOGY

## 2019-08-22 ENCOUNTER — Encounter: Payer: Medicaid Other | Admitting: Obstetrics and Gynecology

## 2019-08-23 ENCOUNTER — Ambulatory Visit (INDEPENDENT_AMBULATORY_CARE_PROVIDER_SITE_OTHER): Payer: Managed Care, Other (non HMO) | Admitting: Obstetrics & Gynecology

## 2019-08-23 ENCOUNTER — Other Ambulatory Visit: Payer: Self-pay

## 2019-08-23 ENCOUNTER — Encounter: Payer: Self-pay | Admitting: Obstetrics & Gynecology

## 2019-08-23 VITALS — BP 100/60 | Ht 66.0 in | Wt 98.2 lb

## 2019-08-23 DIAGNOSIS — O039 Complete or unspecified spontaneous abortion without complication: Secondary | ICD-10-CM

## 2019-08-23 NOTE — Progress Notes (Signed)
  History of Present Illness:  Wendy [name] is a 44 y.o. who recently underwent ER visit and procedure related to spon miscarriage, with extraction of fetal tissue (thus avoiding D&C) in the exam room.  She bled for a week afterwards but no more now. No pain. Plans to try for pregnancy again soon.  PMHx: She  has no past medical history on file. Also,  has a past surgical history that includes Cesarean section and Eye surgery (Bilateral)., family history includes Diabetes in her mother.,  reports that she has never smoked. She has never used smokeless tobacco. She reports that she does not drink alcohol and does not use drugs. No outpatient medications have been marked as taking for the 08/23/19 encounter (Office Visit) with Gae Dry, MD.  . Also, has No Known Allergies..  Review of Systems  Constitutional: Negative for chills, fever and malaise/fatigue.  HENT: Negative for congestion, sinus pain and sore throat.   Eyes: Negative for blurred vision and pain.  Respiratory: Negative for cough and wheezing.   Cardiovascular: Negative for chest pain and leg swelling.  Gastrointestinal: Negative for abdominal pain, constipation, diarrhea, heartburn, nausea and vomiting.  Genitourinary: Negative for dysuria, frequency, hematuria and urgency.  Musculoskeletal: Negative for back pain, joint pain, myalgias and neck pain.  Skin: Negative for itching and rash.  Neurological: Negative for dizziness, tremors and weakness.  Endo/Heme/Allergies: Does not bruise/bleed easily.  Psychiatric/Behavioral: Negative for depression. The patient is not nervous/anxious and does not have insomnia.     Physical Exam:  BP 100/60   Ht 5\' 6"  (1.676 m)   Wt 98 lb 3.2 oz (44.5 kg)   BMI 15.85 kg/m  Body mass index is 15.85 kg/m. Constitutional: Well nourished, well developed female in no acute distress.  Abdomen: diffusely non tender to palpation, non distended, and no masses, hernias Neuro: Grossly  intact Psych:  Normal mood and affect.    Assessment:  Problem List Items Addressed This Visit    Complete abortion    -  Completed and recovering well Plan to monitor for cycle pattern and to try for pregnancy after next cycle No BC desired Plan appt once she conceives for early preg monitoring Risks of miscarriage and AMA discussed. PNV advised      Plan: Detailed discussion of results today. Options for management discussed. Info provided. She was amenable to this plan and we will see her back for annual/PRN. Needs PAP 2022  A total of 20 minutes were spent face-to-face with the patient as well as preparation, review, communication, and documentation during this encounter.    Barnett Applebaum, MD, [facility], [facility] 08/23/2019  3:42 PM

## 2019-08-31 ENCOUNTER — Ambulatory Visit: Payer: Managed Care, Other (non HMO) | Attending: Internal Medicine

## 2019-08-31 DIAGNOSIS — Z23 Encounter for immunization: Secondary | ICD-10-CM

## 2019-08-31 NOTE — Progress Notes (Signed)
   Covid-19 Vaccination Clinic  Name:  Wendy [name]    MRN: [id number] DOB: [birth year]  08/31/2019  Wendy [name] was observed post Covid-19 immunization for 15 minutes without incident. She was provided with Vaccine Information Sheet and instruction to access the V-Safe system.   Wendy [name] was instructed to call 911 with any severe reactions post vaccine: Marland Kitchen Difficulty breathing  . Swelling of face and throat  . A fast heartbeat  . A bad rash all over body  . Dizziness and weakness   Immunizations Administered    Name Date Dose VIS Date Route   Pfizer COVID-19 Vaccine 08/31/2019 10:38 AM 0.3 mL 05/08/2018 Intramuscular   Manufacturer: [place]   Lot: [id number]   [place]: [address]-8

## 2019-10-14 ENCOUNTER — Other Ambulatory Visit: Payer: Self-pay

## 2019-10-14 ENCOUNTER — Ambulatory Visit
Admission: EM | Admit: 2019-10-14 | Discharge: 2019-10-14 | Disposition: A | Discharge status: Home / Self Care | Payer: Managed Care, Other (non HMO) | Attending: Emergency Medicine | Admitting: Emergency Medicine

## 2019-10-14 DIAGNOSIS — J029 Acute pharyngitis, unspecified: Secondary | ICD-10-CM

## 2019-10-14 LAB — POCT RAPID STREP A (OFFICE): Rapid Strep A Screen: NEGATIVE

## 2019-10-14 NOTE — Discharge Instructions (Signed)
Your rapid strep test is negative.  A throat culture is pending; we will call you if it is positive requiring treatment.    Take Tylenol or ibuprofen as needed for discomfort.  Follow up with your primary care provider if your symptoms are not improving.    

## 2019-10-14 NOTE — ED Triage Notes (Signed)
Pt presents to UC for cough, sore throat, and muscle pain x1 week. Pt denies fever, chills, n/v/d. Pt has been treating with alkaseltzer cold and flu with out relief. Pt denying covid testing at this time, education provided on prevalence and risk. Pt agreeable to strep testing.

## 2019-10-14 NOTE — ED Provider Notes (Signed)
[name]    CSN: [id number] Arrival date & time: 10/14/19  1502      History   Chief Complaint Chief Complaint  Patient presents with   Cough   Sore Throat    HPI Wendy [name] is a 44 y.o. female.  Patient presents with sore throat, nonproductive cough, and body aches x1 week.  She denies fever, chills, earache, shortness of breath, abdominal pain, vomiting, diarrhea, rash, or other symptoms.  She has been treating her symptoms at home with Alka-Seltzer cold and flu.  Patient declines COVID test.  She is fully vaccinated for COVID.  The history is provided by the patient.    History reviewed. No pertinent past medical history.  Patient Active Problem List   Diagnosis Date Noted   Vaginal bleeding affecting early pregnancy    Pelvic pain     Past Surgical History:  Procedure Laterality Date   CESAREAN SECTION     EYE SURGERY Bilateral     OB History    Gravida  1   Para      Term      Preterm      AB  1   Living        SAB  1   TAB      Ectopic      Multiple      Live Births               Home Medications    Prior to Admission medications   Medication Sig Start Date End Date Taking? Authorizing Provider  ibuprofen (ADVIL) 600 MG tablet Take 1 tablet (600 mg total) by mouth every 6 (six) hours as needed for moderate pain. Patient not taking: Reported on 08/23/2019 08/11/19   Gae Dry, MD  Multiple Vitamins-Minerals (MULTIVITAMIN WITH MINERALS) tablet Take 1 tablet by mouth daily. Patient not taking: Reported on 08/23/2019    [provider]    Family History Family History  Problem Relation Age of Onset   Diabetes Mother     Social History Social History   Tobacco Use   Smoking status: Never Smoker   Smokeless tobacco: Never Used  Scientific laboratory technician Use: Never used  Substance Use Topics   Alcohol use: Never   Drug use: Never     Allergies   Patient has no known allergies.   Review  of Systems Review of Systems  Constitutional: Negative for chills and fever.  HENT: Positive for sore throat. Negative for congestion, ear pain and rhinorrhea.   Eyes: Negative for pain and visual disturbance.  Respiratory: Positive for cough. Negative for shortness of breath.   Cardiovascular: Negative for chest pain and palpitations.  Gastrointestinal: Negative for abdominal pain, diarrhea, nausea and vomiting.  Genitourinary: Negative for dysuria and hematuria.  Musculoskeletal: Negative for arthralgias and back pain.  Skin: Negative for color change and rash.  Neurological: Negative for seizures and syncope.  All other systems reviewed and are negative.    Physical Exam Triage Vital Signs ED Triage Vitals  Enc Vitals Group     BP      Pulse      Resp      Temp      Temp src      SpO2      Weight      Height      Head Circumference      Peak Flow      Pain Score  Pain Loc      Pain Edu?      Excl. in [place]?    No data found.  Updated Vital Signs BP 101/66 (BP Location: Right Arm)    Pulse 75    Temp 98.3 F (36.8 C) (Oral)    Resp 16    LMP 09/30/2019 (Approximate)    SpO2 99%   Visual Acuity Right Eye Distance:   Left Eye Distance:   Bilateral Distance:    Right Eye Near:   Left Eye Near:    Bilateral Near:     Physical Exam Vitals and nursing note reviewed.  Constitutional:      General: She is not in acute distress.    Appearance: She is well-developed. She is not ill-appearing.  HENT:     Head: Normocephalic and atraumatic.     Right Ear: Tympanic membrane normal.     Left Ear: Tympanic membrane normal.     Nose: Nose normal.     Mouth/Throat:     Mouth: Mucous membranes are moist.     Pharynx: Posterior oropharyngeal erythema present. No oropharyngeal exudate.  Eyes:     Conjunctiva/sclera: Conjunctivae normal.  Cardiovascular:     Rate and Rhythm: Normal rate and regular rhythm.     Heart sounds: No murmur heard.   Pulmonary:     Effort:  Pulmonary effort is normal. No respiratory distress.     Breath sounds: Normal breath sounds.  Abdominal:     Palpations: Abdomen is soft.     Tenderness: There is no abdominal tenderness. There is no guarding or rebound.  Musculoskeletal:     Cervical back: Neck supple.  Skin:    General: Skin is warm and dry.     Findings: No rash.  Neurological:     General: No focal deficit present.     Mental Status: She is alert and oriented to person, place, and time.     Gait: Gait normal.  Psychiatric:        Mood and Affect: Mood normal.        Behavior: Behavior normal.      UC Treatments / Results  Labs (all labs ordered are listed, but only abnormal results are displayed) Labs Reviewed  CULTURE, GROUP A STREP [facility])  POCT RAPID STREP A (OFFICE)    EKG   Radiology No results found.  Procedures Procedures (including critical care time)  Medications Ordered in UC Medications - No data to display  Initial Impression / Assessment and Plan / UC Course  I have reviewed the triage vital signs and the nursing notes.  Pertinent labs & imaging results that were available during my care of the patient were reviewed by me and considered in my medical decision making (see chart for details).   Sore throat.  Rapid strep negative; throat culture pending.  Patient refuses COVID test.  Discussed symptomatic treatment with Tylenol or ibuprofen as needed.  Instructed patient to follow-up with her PCP if her symptoms are not improving.  Patient agrees to plan of care.     Final Clinical Impressions(s) / UC Diagnoses   Final diagnoses:  Sore throat     Discharge Instructions     Your rapid strep test is negative.  A throat culture is pending; we will call you if it is positive requiring treatment.    Take Tylenol or ibuprofen as needed for discomfort.    Follow-up with your primary care provider if your symptoms are not improving.  ED Prescriptions    None     PDMP  not reviewed this encounter.   Sharion Balloon, NP 10/14/19 [phone]

## 2019-10-17 LAB — CULTURE, GROUP A STREP (THRC)

## 2019-12-17 ENCOUNTER — Ambulatory Visit (INDEPENDENT_AMBULATORY_CARE_PROVIDER_SITE_OTHER): Payer: Managed Care, Other (non HMO) | Admitting: Obstetrics & Gynecology

## 2019-12-17 ENCOUNTER — Encounter: Payer: Self-pay | Admitting: Obstetrics & Gynecology

## 2019-12-17 ENCOUNTER — Other Ambulatory Visit: Payer: Self-pay | Admitting: Obstetrics & Gynecology

## 2019-12-17 ENCOUNTER — Other Ambulatory Visit: Payer: Self-pay

## 2019-12-17 ENCOUNTER — Encounter: Payer: Self-pay | Admitting: Radiology

## 2019-12-17 VITALS — BP 109/70 | HR 89 | Wt 103.4 lb

## 2019-12-17 DIAGNOSIS — Z3A11 11 weeks gestation of pregnancy: Secondary | ICD-10-CM

## 2019-12-17 DIAGNOSIS — O09529 Supervision of elderly multigravida, unspecified trimester: Secondary | ICD-10-CM | POA: Insufficient documentation

## 2019-12-17 DIAGNOSIS — O219 Vomiting of pregnancy, unspecified: Secondary | ICD-10-CM

## 2019-12-17 DIAGNOSIS — O34219 Maternal care for unspecified type scar from previous cesarean delivery: Secondary | ICD-10-CM | POA: Insufficient documentation

## 2019-12-17 DIAGNOSIS — Z23 Encounter for immunization: Secondary | ICD-10-CM

## 2019-12-17 DIAGNOSIS — O09292 Supervision of pregnancy with other poor reproductive or obstetric history, second trimester: Secondary | ICD-10-CM | POA: Insufficient documentation

## 2019-12-17 MED ORDER — ONDANSETRON 4 MG PO TBDP: 4.0000 mg | ORAL_TABLET | Freq: Four times a day (QID) | ORAL | 0 refills | Status: DC | PRN

## 2019-12-17 MED ORDER — PREPLUS 27-1 MG PO TABS: 1.0000 | ORAL_TABLET | Freq: Every day | ORAL | 13 refills | Status: DC

## 2019-12-17 MED ORDER — PROMETHAZINE HCL 25 MG PO TABS: 25.0000 mg | ORAL_TABLET | Freq: Four times a day (QID) | ORAL | 2 refills | Status: DC | PRN

## 2019-12-17 NOTE — Progress Notes (Signed)
History:   Wendy [name] is a 44 y.o. [id number] at [redacted]w[redacted]d by ultrasound in office today not consistent with LMP, being seen today for her first obstetrical visit.  Her obstetrical history is significant for advanced maternal age and history of two pregnancy losses; most recently at around 8 weeks and the one in 2019 was around 22 weeks.  Describes just having bleeding and delivering, happened in [place].  No water breaking. Does not recall if anyone mentioned concerns about her cervix or need for cerclage in subsequent pregnancies. Patient does intend to breast feed. Pregnancy history fully reviewed.  Patient reports nausea and vomiting and wants medication for it.    HISTORY: OB History  Gravida Para Term Preterm AB Living  4 2 1 1 1 1   SAB TAB Ectopic Multiple Live Births  1 0 0 0 1    # Outcome Date GA Lbr Len/2nd Weight Sex Delivery Anes PTL Lv  4 Current           3 SAB 07/2019 [redacted]w[redacted]d         2 Preterm 08/2017 [redacted]w[redacted]d         1 Term 12/13/10    F CS-LTranv EPI N LIV  Reports normal pap smears, cannot recall last one.  No past medical history on file. Past Surgical History:  Procedure Laterality Date  . CESAREAN SECTION    . EYE SURGERY Bilateral    Family History  Problem Relation Age of Onset  . Diabetes Mother    Social History   Tobacco Use  . Smoking status: Never Smoker  . Smokeless tobacco: Never Used  Vaping Use  . Vaping Use: Never used  Substance Use Topics  . Alcohol use: Never  . Drug use: Never   No Known Allergies No current outpatient medications on file prior to visit.   No current facility-administered medications on file prior to visit.    Review of Systems Pertinent items noted in HPI and remainder of comprehensive ROS otherwise negative. Physical Exam:   Vitals:   12/17/19 1458  BP: 109/70  Pulse: 89  Weight: 103 lb 6.4 oz (46.9 kg)   Fetal Heart Rate (bpm): 158 bpm  Uterus:     Pelvic Exam: Perineum: no hemorrhoids, normal perineum   Vulva:  normal external genitalia, no lesions   Vagina:  normal mucosa, normal discharge   Cervix: about 1 cm length in vagina, small, no lesions, very posterior, pap smear done.    Adnexa: normal adnexa and no mass, fullness, tenderness   Bony Pelvis: average  System: General: well-developed, well-nourished female in no acute distress   Breasts:  normal appearance, no masses or tenderness bilaterally   Skin: normal coloration and turgor, no rashes   Neurologic: oriented, normal, negative, normal mood   Extremities: normal strength, tone, and muscle mass, ROM of all joints is normal   HEENT PERRLA, extraocular movement intact and sclera clear, anicteric   Neck supple and no masses   Cardiovascular: regular rate and rhythm   Respiratory:  no respiratory distress, normal breath sounds   Abdomen: soft, non-tender; bowel sounds normal; no masses,  no organomegaly    Assessment:    Pregnancy: [id number] Patient Active Problem List   Diagnosis Date Noted  . Encounter for supervision of high-risk pregnancy with elderly multigravida >73 years of age 21/07/2019  . Prior perinatal loss in second trimester, antepartum 12/17/2019  . Previous cesarean delivery, antepartum 12/17/2019     Plan:    1.  Nausea/vomiting in pregnancy Antiemetics prescribed.  - ondansetron (ZOFRAN ODT) 4 MG disintegrating tablet; Take 1 tablet (4 mg total) by mouth every 6 (six) hours as needed for nausea.  Dispense: 20 tablet; Refill: 0 - promethazine (PHENERGAN) 25 MG tablet; Take 1 tablet (25 mg total) by mouth every 6 (six) hours as needed for nausea or vomiting.  Dispense: 30 tablet; Refill: 2 - Comprehensive metabolic panel  2. Prior perinatal loss in second trimester, antepartum Worried about cervical incompetence.  Discussed cervical cerclage, offered this but she wants to consider this. Information given to review at home. Cervical length scan ordered and MFM consult consult around 16 weeks.   - [place] MFM OB TRANSVAGINAL;  Future - AMB referral to maternal fetal medicine  3. Previous cesarean delivery, antepartum Will discuss mode of delivery later in pregnancy.  4. Encounter for supervision of high-risk pregnancy with elderly multigravida Will need antenatal testing weekly starting at 36 weeks, serial growth scans, delivery at 39-40 weeks.  - MaterniT21 PLUS Core+SCA - Inheritest Society Guided - AMB referral to maternal fetal medicine - [place] MFM OB DETAIL +14 WK; Future  5. [redacted] weeks gestation of pregnancy - Flu Vaccine QUAD 36+ mos IM (Fluarix, Quad PF) - Prenatal Vit-Fe Fumarate-FA (PREPLUS) 27-1 MG TABS; Take 1 tablet by mouth daily.  Dispense: 30 tablet; Refill: 13 - CBC/D/Plt+RPR+Rh+ABO+Rub Ab... - Culture, OB Urine - IGP, Aptima HPV, rfx 16/18,45 - GC/Chlamydia Probe Amp - Enroll Patient in Babyscripts - Babyscripts Schedule Optimization Initial labs drawn. Continue prenatal vitamins. Problem list reviewed and updated. Ultrasound discussed; fetal anatomic survey: ordered. Anticipatory guidance about prenatal visits given including labs, ultrasounds, and testing. Discussed usage of Babyscripts and virtual visits as additional source of managing and completing prenatal visits in midst of coronavirus and pandemic.   Encouraged to complete MyChart Registration for her ability to review results, send requests, and have questions addressed.  The nature of [place] for [facility] with multiple MDs and Advanced Practice Providers was explained to patient; also emphasized that residents, students are part of our team. Routine obstetric precautions reviewed. Encouraged to seek out care at office or emergency room [facility] preferred) for urgent and/or emergent concerns. Return in about 4 weeks (around 01/14/2020) for OFFICE OB Visit.     Verita Schneiders, MD, [place] for [employer], [place]

## 2019-12-17 NOTE — Progress Notes (Signed)
DATING AND VIABILITY SONOGRAM   Wendy [name] is a 44 y.o. year old [id number] with LMP Patient's last menstrual period was 09/28/2019 (approximate). which would correlate to  [redacted]w[redacted]d weeks gestation.  She has regular menstrual cycles.   She is here today for a confirmatory initial sonogram.    GESTATION: SINGLETON yes     FETAL ACTIVITY:          Heart rate       158          The fetus is active.   GESTATIONAL AGE AND  BIOMETRICS:  Gestational criteria: Estimated Date of Delivery: 06/22/20 by early ultrasound now at [redacted]w[redacted]d  Previous Scans:0  GESTATIONAL SAC            mm          weeks  CROWN RUMP LENGTH           7.00 cm         13.1 weeks                                                   AVERAGE EGA(BY THIS SCAN):  13.1weeks  WORKING EDD( early ultrasound ):  06/22/2020     TECHNICIAN COMMENTS: Patient informed that the ultrasound is considered a limited obstetric ultrasound and is not intended to be a complete ultrasound exam. Patient also informed that the ultrasound is not being completed with the intent of assessing for fetal or placental anomalies or any pelvic abnormalities. Explained that the purpose of today's ultrasound is to assess for fetal heart rate. Patient acknowledges the purpose of the exam and the limitations of the study.         A copy of this report including all images has been saved and backed up to a second source for retrieval if needed. All measures and details of the anatomical scan, placentation, fluid volume and pelvic anatomy are contained in that report.  Crosby Oyster 12/17/2019 3:34 PM

## 2019-12-17 NOTE — Patient Instructions (Addendum)
Thank you for enrolling in [place]. Please follow the instructions below to securely access your online medical record. MyChart allows you to send messages to your doctor, view your test results, manage appointments, and more.   How Do I Sign Up? 1. In your Internet browser, go to [employer] and enter [email or web address]. 2. Click on the Sign Up Now link in the Sign In box. You will see the New Member Sign Up page. 3. Enter your MyChart Access Code exactly as it appears below. You will not need to use this code after you've completed the sign-up process. If you do not sign up before the expiration date, you must request a new code.  MyChart Access Code: [email or web address] Expires: 01/31/2020  3:31 PM  4. Enter your Social Security Number ([email or web address]) and Date of Birth (mm/dd/yyyy) as indicated and click Submit. You will be taken to the next sign-up page. 5. Create a MyChart ID. This will be your MyChart login ID and cannot be changed, so think of one that is secure and easy to remember. 6. Create a MyChart password. You can change your password at any time. 7. Enter your Password Reset Question and Answer. This can be used at a later time if you forget your password.  8. Enter your e-mail address. You will receive e-mail notification when new information is available in [facility]. 9. Click Sign Up. You can now view your medical record.   Additional Information Remember, MyChart is NOT to be used for urgent needs. For medical emergencies, dial 911.    Cervical Cerclage  Cervical cerclage is a surgical procedure to correct a cervix that opens up and thins out before pregnancy is at term. This is also called cervical insufficiency, or incompetent cervix. This condition can cause labor to start early (prematurely). In this procedure, a health care provider uses stitches (sutures) to sew the cervix shut during pregnancy. Your health care provider may use ultrasound to help guide the  procedure and monitor your baby. Ultrasound uses sound waves to take images of your cervix and uterus. The health care provider will assess these images on a monitor in the operating room. Tell a health care provider about:  Any allergies you have, especially any allergies related to prescribed medicine, stitches, or anesthetic medicines.  Any problems you or family members have had with anesthetic medicines.  Any blood disorders you have.  Any surgeries you have had, including prior cervical stitching.  Any medical conditions you have or have had. What are the risks? Generally, this is a safe procedure. However, problems may occur, including:  Infection, such as infection of the cervix or the bag of fluid that surrounds the baby (amniotic sac).  Vaginal bleeding.  Allergic reactions to medicines.  Damage to nearby structures or organs, such as injury to the cervix or tearing of the amniotic sac.  Contractions that come too early, including going into early labor and delivery.  Cervical dystocia. This occurs when the cervix is unable to open normally during labor. What happens before the procedure? Staying hydrated Follow instructions from your health care provider about hydration, which may include:  Up to 2 hours before the procedure - you may continue to drink clear liquids, such as water, clear fruit juice, black coffee, and plain tea.  Eating and drinking restrictions Follow instructions from your health care provider about eating and drinking, which may include:  8 hours before the procedure - stop eating heavy meals or foods, such as meat, fried  foods, or fatty foods.  6 hours before the procedure - stop eating light meals or foods, such as toast or cereal.  6 hours before the procedure - stop drinking milk or drinks that contain milk.  2 hours before the procedure - stop drinking clear liquids. Medicines Ask your health care provider about:  Changing or stopping  your regular medicines. This is especially important if you are taking diabetes medicines or blood thinners.  Taking medicines such as aspirin and ibuprofen. These medicines can thin your blood. Do not take these medicines unless your health care provider tells you to take them.  Taking over-the-counter medicines, vitamins, herbs, and supplements. Surgery safety Ask your health care provider:  How your surgery site will be marked.  What steps will be taken to help prevent infection. These may include: ? Removing hair at the surgery site. ? Washing skin with a germ-killing soap. ? Taking antibiotic medicine. General instructions  Do not put on any lotion, deodorant, or perfume.  Remove contact lenses and jewelry.  You may have an exam or testing, including blood or urine tests.  Plan to have someone take you home from the hospital or clinic.  If you will be going home right after the procedure, plan to have someone with you for 24 hours. What happens during the procedure?  An IV will be inserted into one of your veins.  You may be given one or more of the following: ? A medicine to help you relax (sedative). ? A medicine to numb the area (local anesthetic). ? A medicine to make you fall asleep (general anesthetic). ? A medicine that is injected into your spine to numb the area below and slightly above the injection site (spinal anesthetic).  A lubricated instrument (speculum) will be inserted into your vagina. The speculum will be widened to open the walls of your vagina so your surgeon can see your cervix.  Your cervix will be grasped and tightly sutured to close it. To do this, your surgeon will stitch a strong band of thread around your cervix, then the thread will be tightened to hold your cervix shut. The procedure may vary among health care providers and hospitals. What happens after the procedure?  Your blood pressure, heart rate, breathing rate, and blood oxygen level  will be monitored until you leave the hospital or clinic.  You will be monitored for premature contractions.  You may have light bleeding and mild cramping.  You may have to wear compression stockings. These stockings help to prevent blood clots and reduce swelling in your legs.  If you were given a sedative during the procedure, it can affect you for several hours. Do not drive or operate machinery until your health care provider says that it is safe.  You may be put on bed rest.  You may be given an injection of a hormone (progesterone) to prevent premature contractions. Summary  Cervical cerclage is a surgical procedure in which stitches are used to sew the cervix shut during pregnancy.  Before the procedure, tell your health care provider about your medicines, or medical problems or blood disorders that you have.  This is a safe procedure. However, problems may occur, including infection, bleeding, or premature labor.  Follow all instructions about eating and drinking before the procedure. Plan to have someone drive you home from the hospital or clinic. This information is not intended to replace advice given to you by your health care provider. Make sure you discuss any  questions you have with your health care provider. Document Revised: 12/25/2018 Document Reviewed: 10/24/2018 Elsevier Patient Education  [place] of Pregnancy The first trimester of pregnancy is from week 1 until the end of week 13 (months 1 through 3). A week after a sperm fertilizes an egg, the egg will implant on the wall of the uterus. This embryo will begin to develop into a baby. Genes from you and your partner will form the baby. The female genes will determine whether the baby will be a boy or a girl. At 6-8 weeks, the eyes and face will be formed, and the heartbeat can be seen on ultrasound. At the end of 12 weeks, all the baby's organs will be formed. Now that you are pregnant,  you will want to do everything you can to have a healthy baby. Two of the most important things are to get good prenatal care and to follow your health care provider's instructions. Prenatal care is all the medical care you receive before the baby's birth. This care will help prevent, find, and treat any problems during the pregnancy and childbirth. Body changes during your first trimester Your body goes through many changes during pregnancy. The changes vary from woman to woman.  You may gain or lose a couple of pounds at first.  You may feel sick to your stomach (nauseous) and you may throw up (vomit). If the vomiting is uncontrollable, call your health care provider.  You may tire easily.  You may develop headaches that can be relieved by medicines. All medicines should be approved by your health care provider.  You may urinate more often. Painful urination may mean you have a bladder infection.  You may develop heartburn as a result of your pregnancy.  You may develop constipation because certain hormones are causing the muscles that push stool through your intestines to slow down.  You may develop hemorrhoids or swollen veins (varicose veins).  Your breasts may begin to grow larger and become tender. Your nipples may stick out more, and the tissue that surrounds them (areola) may become darker.  Your gums may bleed and may be sensitive to brushing and flossing.  Dark spots or blotches (chloasma, mask of pregnancy) may develop on your face. This will likely fade after the baby is born.  Your menstrual periods will stop.  You may have a loss of appetite.  You may develop cravings for certain kinds of food.  You may have changes in your emotions from day to day, such as being excited to be pregnant or being concerned that something may go wrong with the pregnancy and baby.  You may have more vivid and strange dreams.  You may have changes in your hair. These can include  thickening of your hair, rapid growth, and changes in texture. Some women also have hair loss during or after pregnancy, or hair that feels dry or thin. Your hair will most likely return to normal after your baby is born. What to expect at prenatal visits During a routine prenatal visit:  You will be weighed to make sure you and the baby are growing normally.  Your blood pressure will be taken.  Your abdomen will be measured to track your baby's growth.  The fetal heartbeat will be listened to between weeks 10 and 14 of your pregnancy.  Test results from any previous visits will be discussed. Your health care provider may ask you:  How you are feeling.  If you are feeling the baby move.  If you have had any abnormal symptoms, such as leaking fluid, bleeding, severe headaches, or abdominal cramping.  If you are using any tobacco products, including cigarettes, chewing tobacco, and electronic cigarettes.  If you have any questions. Other tests that may be performed during your first trimester include:  Blood tests to find your blood type and to check for the presence of any previous infections. The tests will also be used to check for low iron levels (anemia) and protein on red blood cells (Rh antibodies). Depending on your risk factors, or if you previously had diabetes during pregnancy, you may have tests to check for high blood sugar that affects pregnant women (gestational diabetes).  Urine tests to check for infections, diabetes, or protein in the urine.  An ultrasound to confirm the proper growth and development of the baby.  Fetal screens for spinal cord problems (spina bifida) and Down syndrome.  HIV (human immunodeficiency virus) testing. Routine prenatal testing includes screening for HIV, unless you choose not to have this test.  You may need other tests to make sure you and the baby are doing well. Follow these instructions at home: Medicines  Follow your health care  provider's instructions regarding medicine use. Specific medicines may be either safe or unsafe to take during pregnancy.  Take a prenatal vitamin that contains at least 600 micrograms (mcg) of folic acid.  If you develop constipation, try taking a stool softener if your health care provider approves. Eating and drinking   Eat a balanced diet that includes fresh fruits and vegetables, whole grains, good sources of protein such as meat, eggs, or tofu, and low-fat dairy. Your health care provider will help you determine the amount of weight gain that is right for you.  Avoid raw meat and uncooked cheese. These carry germs that can cause birth defects in the baby.  Eating four or five small meals rather than three large meals a day may help relieve nausea and vomiting. If you start to feel nauseous, eating a few soda crackers can be helpful. Drinking liquids between meals, instead of during meals, also seems to help ease nausea and vomiting.  Limit foods that are high in fat and processed sugars, such as fried and sweet foods.  To prevent constipation: ? Eat foods that are high in fiber, such as fresh fruits and vegetables, whole grains, and beans. ? Drink enough fluid to keep your urine clear or pale yellow. Activity  Exercise only as directed by your health care provider. Most women can continue their usual exercise routine during pregnancy. Try to exercise for 30 minutes at least 5 days a week. Exercising will help you: ? Control your weight. ? Stay in shape. ? Be prepared for labor and delivery.  Experiencing pain or cramping in the lower abdomen or lower back is a good sign that you should stop exercising. Check with your health care provider before continuing with normal exercises.  Try to avoid standing for long periods of time. Move your legs often if you must stand in one place for a long time.  Avoid heavy lifting.  Wear low-heeled shoes and practice good posture.  You may  continue to have sex unless your health care provider tells you not to. Relieving pain and discomfort  Wear a good support bra to relieve breast tenderness.  Take warm sitz baths to soothe any pain or discomfort caused by hemorrhoids. Use hemorrhoid cream if your health care  provider approves.  Rest with your legs elevated if you have leg cramps or low back pain.  If you develop varicose veins in your legs, wear support hose. Elevate your feet for 15 minutes, 3-4 times a day. Limit salt in your diet. Prenatal care  Schedule your prenatal visits by the twelfth week of pregnancy. They are usually scheduled monthly at first, then more often in the last 2 months before delivery.  Write down your questions. Take them to your prenatal visits.  Keep all your prenatal visits as told by your health care provider. This is important. Safety  Wear your seat belt at all times when driving.  Make a list of emergency phone numbers, including numbers for family, friends, the hospital, and police and fire departments. General instructions  Ask your health care provider for a referral to a local prenatal education class. Begin classes no later than the beginning of month 6 of your pregnancy.  Ask for help if you have counseling or nutritional needs during pregnancy. Your health care provider can offer advice or refer you to specialists for help with various needs.  Do not use hot tubs, steam rooms, or saunas.  Do not douche or use tampons or scented sanitary pads.  Do not cross your legs for long periods of time.  Avoid cat litter boxes and soil used by cats. These carry germs that can cause birth defects in the baby and possibly loss of the fetus by miscarriage or stillbirth.  Avoid all smoking, herbs, alcohol, and medicines not prescribed by your health care provider. Chemicals in these products affect the formation and growth of the baby.  Do not use any products that contain nicotine or  tobacco, such as cigarettes and e-cigarettes. If you need help quitting, ask your health care provider. You may receive counseling support and other resources to help you quit.  Schedule a dentist appointment. At home, brush your teeth with a soft toothbrush and be gentle when you floss. Contact a health care provider if:  You have dizziness.  You have mild pelvic cramps, pelvic pressure, or nagging pain in the abdominal area.  You have persistent nausea, vomiting, or diarrhea.  You have a bad smelling vaginal discharge.  You have pain when you urinate.  You notice increased swelling in your face, hands, legs, or ankles.  You are exposed to fifth disease or chickenpox.  You are exposed to [place] measles (rubella) and have never had it. Get help right away if:  You have a fever.  You are leaking fluid from your vagina.  You have spotting or bleeding from your vagina.  You have severe abdominal cramping or pain.  You have rapid weight gain or loss.  You vomit blood or material that looks like coffee grounds.  You develop a severe headache.  You have shortness of breath.  You have any kind of trauma, such as from a fall or a car accident. Summary  The first trimester of pregnancy is from week 1 until the end of week 13 (months 1 through 3).  Your body goes through many changes during pregnancy. The changes vary from woman to woman.  You will have routine prenatal visits. During those visits, your health care provider will examine you, discuss any test results you may have, and talk with you about how you are feeling. This information is not intended to replace advice given to you by your health care provider. Make sure you discuss any questions you have with  your health care provider. Document Revised: 02/10/2017 Document Reviewed: 02/10/2016 Elsevier Patient Education  2020 [employer].

## 2019-12-18 LAB — CBC/D/PLT+RPR+RH+ABO+RUB AB...
Antibody Screen: NEGATIVE
Basophils Absolute: 0 10*3/uL (ref 0.0–0.2)
Basos: 0 %
EOS (ABSOLUTE): 0 10*3/uL (ref 0.0–0.4)
Eos: 1 %
HCV Ab: <0.1 s/co ratio (ref 0.0–0.9)
HIV Screen 4th Generation wRfx: NONREACTIVE
Hematocrit: 37.4 % (ref 34.0–46.6)
Hemoglobin: 12.8 g/dL (ref 11.1–15.9)
Hepatitis B Surface Ag: NEGATIVE
Immature Grans (Abs): 0 10*3/uL (ref 0.0–0.1)
Immature Granulocytes: 1 %
Lymphocytes Absolute: 1.4 10*3/uL (ref 0.7–3.1)
Lymphs: 21 %
MCH: 28.8 pg (ref 26.6–33.0)
MCHC: 34.2 g/dL (ref 31.5–35.7)
MCV: 84 fL (ref 79–97)
Monocytes Absolute: 0.5 10*3/uL (ref 0.1–0.9)
Monocytes: 8 %
Neutrophils Absolute: 4.6 10*3/uL (ref 1.4–7.0)
Neutrophils: 69 %
Platelets: 228 10*3/uL (ref 150–450)
RBC: 4.45 x10E6/uL (ref 3.77–5.28)
RDW: 14.9 % (ref 11.7–15.4)
RPR Ser Ql: NONREACTIVE
Rh Factor: POSITIVE
Rubella Antibodies, IGG: 17.3 index (ref >0.99)
WBC: 6.6 10*3/uL (ref 3.4–10.8)

## 2019-12-18 LAB — HCV INTERPRETATION

## 2019-12-19 LAB — GC/CHLAMYDIA PROBE AMP
Chlamydia trachomatis, NAA: NEGATIVE
Neisseria Gonorrhoeae by PCR: NEGATIVE

## 2019-12-19 LAB — CULTURE, OB URINE

## 2019-12-19 LAB — URINE CULTURE, OB REFLEX

## 2019-12-22 LAB — IGP, APTIMA HPV, RFX 16/18,45
HPV Aptima: NEGATIVE
PAP Smear Comment: 0

## 2020-01-06 LAB — MATERNIT21 PLUS CORE+SCA
Fetal Fraction: 7
Monosomy X (Turner Syndrome): NOT DETECTED
Result (T21): NEGATIVE
Trisomy 13 (Patau syndrome): NEGATIVE
Trisomy 18 (Edwards syndrome): NEGATIVE
Trisomy 21 (Down syndrome): NEGATIVE
XXX (Triple X Syndrome): NOT DETECTED
XXY (Klinefelter Syndrome): NOT DETECTED
XYY (Jacobs Syndrome): NOT DETECTED

## 2020-01-06 LAB — COMPREHENSIVE METABOLIC PANEL
ALT: 10 IU/L (ref 0–32)
AST: 16 IU/L (ref 0–40)
Albumin/Globulin Ratio: 1.6 (ref 1.2–2.2)
Albumin: 4.2 g/dL (ref 3.8–4.8)
Alkaline Phosphatase: 66 IU/L (ref 44–121)
BUN/Creatinine Ratio: 21 (ref 9–23)
BUN: 10 mg/dL (ref 6–24)
Bilirubin Total: 0.3 mg/dL (ref 0.0–1.2)
CO2: 22 mmol/L (ref 20–29)
Calcium: 9.7 mg/dL (ref 8.7–10.2)
Chloride: 103 mmol/L (ref 96–106)
Creatinine, Ser: 0.48 mg/dL — ABNORMAL LOW (ref 0.57–1.00)
GFR calc Af Amer: 140 mL/min/{1.73_m2} (ref >59)
GFR calc non Af Amer: 121 mL/min/{1.73_m2} (ref >59)
Globulin, Total: 2.6 g/dL (ref 1.5–4.5)
Glucose: 126 mg/dL — ABNORMAL HIGH (ref 65–99)
Potassium: 4.1 mmol/L (ref 3.5–5.2)
Sodium: 137 mmol/L (ref 134–144)
Total Protein: 6.8 g/dL (ref 6.0–8.5)

## 2020-01-06 LAB — INHERITEST SOCIETY GUIDED

## 2020-01-08 ENCOUNTER — Ambulatory Visit: Payer: Managed Care, Other (non HMO) | Admitting: *Deleted

## 2020-01-08 ENCOUNTER — Ambulatory Visit: Payer: Managed Care, Other (non HMO) | Attending: Obstetrics & Gynecology

## 2020-01-08 ENCOUNTER — Encounter: Payer: Self-pay | Admitting: Obstetrics & Gynecology

## 2020-01-08 ENCOUNTER — Other Ambulatory Visit: Payer: Self-pay

## 2020-01-08 ENCOUNTER — Ambulatory Visit (HOSPITAL_BASED_OUTPATIENT_CLINIC_OR_DEPARTMENT_OTHER): Payer: Managed Care, Other (non HMO) | Admitting: Obstetrics and Gynecology

## 2020-01-08 ENCOUNTER — Encounter: Payer: Self-pay | Admitting: *Deleted

## 2020-01-08 DIAGNOSIS — O09522 Supervision of elderly multigravida, second trimester: Secondary | ICD-10-CM | POA: Diagnosis not present

## 2020-01-08 DIAGNOSIS — Z3A16 16 weeks gestation of pregnancy: Secondary | ICD-10-CM | POA: Insufficient documentation

## 2020-01-08 DIAGNOSIS — O09299 Supervision of pregnancy with other poor reproductive or obstetric history, unspecified trimester: Secondary | ICD-10-CM | POA: Diagnosis not present

## 2020-01-08 DIAGNOSIS — O09292 Supervision of pregnancy with other poor reproductive or obstetric history, second trimester: Secondary | ICD-10-CM | POA: Insufficient documentation

## 2020-01-08 DIAGNOSIS — O09529 Supervision of elderly multigravida, unspecified trimester: Secondary | ICD-10-CM | POA: Insufficient documentation

## 2020-01-08 DIAGNOSIS — O3429 Maternal care due to uterine scar from other previous surgery: Secondary | ICD-10-CM | POA: Insufficient documentation

## 2020-01-08 NOTE — Progress Notes (Signed)
Maternal-Fetal Medicine   Name: Wendy [name] DOB: 05-Apr-1975 MRN: [id number] Referring Provider: Verita Schneiders, MD  I had the [facility] of [name] [facility]. She is here for ultrasound and consultation because of her history of mid-trimester pregnancy loss at 22 weeks' gestation.  Obstetric history -12/2010: Term cesarean delivery (failure to progress in labor) at a [facility], [place] of a female infant weighing 5 pounds at birth.  Her pregnancy was, otherwise, uneventful.  Her daughter is in good health. -2019: At [redacted] weeks gestation, patient had abdominal cramping and was evaluated at the hospital.  According to the patient, no fetal heart activity was seen.  Subsequently, she had preterm rupture membranes.  Patient also reported that they did transvaginal ultrasound, and she was not informed of cervical shortening.  I am unclear whether any autopsy was performed.  GYN history: No history of abnormal Pap smears or cervical surgeries.  Patient gives history of irregular periods.  No history of breast disease.  History of myomectomy.  Past medical history: No history of diabetes or hypertension or any other chronic medical conditions. Past surgical history: Cesarean section, myomectomy (2018), eye surgery (strabismus). Medications: Prenatal vitamins. Allergies: No known drug allergies. Social history: Denies tobacco drug or alcohol use.  She is married and her husband is in good health. Family history: No history of venous thromboembolism in the family.  Prenatal course: On cell free fetal DNA screening, risks of fetal aneuploidies are not increased.  We performed ultrasound today.  Amniotic fluid is normal and good fetal activity seen.  On transvaginal ultrasound, the cervix measures 3.6 cm, which is normal.  No funneling or shortening is seen on transfundal pressure.  Our concerns include: History of 22-week pregnancy loss Based on her  history, the diagnosis is not consistent with cervical incompetence.  Patient also had a term cesarean delivery.  Patient reports she had intrauterine fetal demise before delivery.  Although from her history, cervical incompetence is unlikely, we may be able to get better information from her previous records.  I recommend we make efforts to obtain her previous records from [facility].  I discussed the option of serial cervical length measurements.  Patient is returning for fetal anatomy scan in 3 weeks.  She informed that she is unable to come for transvaginal assessment in 2 weeks. I have not recommended prophylactic cerclage based on her history and today's ultrasound findings.  Advanced maternal age I counseled the patient that advanced maternal age is associated with increased risk of fetal aneuploidies.  I discussed the significance and limitations of cell free fetal DNA screening.  Patient opted not to have amniocentesis.  Previous myomectomy It is important we get the previous operative note to ascertain the extent of myomectomy.  If previous operative note is not available, repeat cesarean delivery at 4 to [redacted] weeks gestation may be considered.  Recommendations -Fetal anatomy scan and cervical length measurement on 01/28/2020. -Previous obstetric records (2019) to be requested. -Operative note of myomectomy to be requested. -If operative note is not available, I recommend cesarean delivery at 54 to [redacted] weeks gestation.  Thank you for consultation.  If you have any questions or concerns, please call me at the [facility]. Consultation including face-to-face counseling 45 minutes.

## 2020-01-09 ENCOUNTER — Other Ambulatory Visit: Payer: Self-pay | Admitting: *Deleted

## 2020-01-09 DIAGNOSIS — O09292 Supervision of pregnancy with other poor reproductive or obstetric history, second trimester: Secondary | ICD-10-CM

## 2020-01-15 ENCOUNTER — Other Ambulatory Visit: Payer: Self-pay

## 2020-01-15 ENCOUNTER — Ambulatory Visit (INDEPENDENT_AMBULATORY_CARE_PROVIDER_SITE_OTHER): Payer: Managed Care, Other (non HMO) | Admitting: Family Medicine

## 2020-01-15 VITALS — BP 95/63 | HR 97 | Wt 108.4 lb

## 2020-01-15 DIAGNOSIS — O09529 Supervision of elderly multigravida, unspecified trimester: Secondary | ICD-10-CM

## 2020-01-15 DIAGNOSIS — K5900 Constipation, unspecified: Secondary | ICD-10-CM

## 2020-01-15 NOTE — Progress Notes (Signed)
   PRENATAL VISIT NOTE  Subjective:  Wendy [name] is a 44 y.o. [id number] at [redacted]w[redacted]d being seen today for ongoing prenatal care.  She is currently monitored for the following issues for this high-risk pregnancy and has Encounter for supervision of high-risk pregnancy with elderly multigravida >41 years of age; Prior perinatal loss in second trimester, antepartum; Previous cesarean delivery, antepartum; and Pregnancy with history of uterine myomectomy on their problem list.  Patient reports no complaints.  Contractions: Irregular. Vag. Bleeding: None.  Movement: Present. Denies leaking of fluid.   The following portions of the patient's history were reviewed and updated as appropriate: allergies, current medications, past family history, past medical history, past social history, past surgical history and problem list.   Objective:   Vitals:   01/15/20 1445  BP: 95/63  Pulse: 97  Weight: 108 lb 6.4 oz (49.2 kg)    Fetal Status: Fetal Heart Rate (bpm): 153   Movement: Present     General:  Alert, oriented and cooperative. Patient is in no acute distress.  Skin: Skin is warm and dry. No rash noted.   Cardiovascular: Normal heart rate noted  Respiratory: Normal respiratory effort, no problems with respiration noted  Abdomen: Soft, gravid, appropriate for gestational age.  Pain/Pressure: Absent     Pelvic: Cervical exam deferred        Extremities: Normal range of motion.  Edema: None  Mental Status: Normal mood and affect. Normal behavior. Normal judgment and thought content.   Assessment and Plan:  Pregnancy: [id number] at [redacted]w[redacted]d  1. Encounter for supervision of high-risk pregnancy with elderly multigravida >43 years of age Reviewed mat11- LR and Female - Hgb Fractionation Cascade - AFP, Serum, Open Spina Bifida  Preterm labor symptoms and general obstetric precautions including but not limited to vaginal bleeding, contractions, leaking of fluid and fetal movement were reviewed in detail with  the patient. Please refer to After Visit Summary for other counseling recommendations.   Return in about 4 weeks (around 02/12/2020) for Routine prenatal care, in person, MD or APP.  Future Appointments  Date Time Provider [place]  01/28/2020  1:15 PM [facility] NURSE [facility]  01/28/2020  1:30 PM [facility] US3 [facility]    Caren Macadam, MD

## 2020-01-15 NOTE — Patient Instructions (Signed)
You have constipation which is hard stools that are difficult to pass. It is important to have regular bowel movements every 1-3 days that are soft and easy to pass. Hard stools increase your risk of hemorrhoids and are very uncomfortable.   To prevent constipation you can increase the amount of fiber in your diet. Examples of foods with fiber are leafy greens, whole grain breads, oatmeal and other grains.  It is also important to drink at least eight 8oz glass of water everyday.   If you have not has a bowel movement in 4-5 days you made need to clean out your bowel.  This will have establish normal movement through your bowel.    Miralax Clean out  Take 8 capfuls of miralax in 64 oz of gatorade/water/juice. You can use any fluid that appeals to you (gatorade, water, juice)  Continue to drink at least eight 8 oz glasses of water throughout the day  You can repeat with another 8 capfuls of miralax in 64 oz of gatorade if you are not having a large amount of stools  You will need to be at home and close to a bathroom for about 8 hours when you do the above as you may need to go to the bathroom frequently.   After you are cleaned out you can start one of these: 1. Colace100mg  twice daily 2. Miralax 1 capful daily once daily 3. Daily fiber supplement like metamucil or citrucel  - You can safely use enemas in pregnancy  - if you are having diarrhea you can reduce to Colace once a day or miralax every other day or a 1/2 capful daily.

## 2020-01-17 LAB — AFP, SERUM, OPEN SPINA BIFIDA
AFP MoM: 0.74
AFP Value: 35.7 ng/mL
Gest. Age on Collection Date: 17 weeks
Maternal Age At EDD: 43.4 yr
OSBR Risk 1 IN: 10000
Test Results:: NEGATIVE
Weight: 108 [lb_av]

## 2020-01-17 LAB — HGB FRACTIONATION CASCADE
Hgb A2: 2.5 % (ref 1.8–3.2)
Hgb A: 97 % (ref 96.4–98.8)
Hgb F: 0.5 % (ref 0.0–2.0)
Hgb S: 0 %

## 2020-01-19 ENCOUNTER — Emergency Department
Admission: EM | Admit: 2020-01-19 | Discharge: 2020-01-19 | Disposition: A | Discharge status: Home / Self Care | Payer: Managed Care, Other (non HMO) | Attending: Emergency Medicine | Admitting: Emergency Medicine

## 2020-01-19 ENCOUNTER — Other Ambulatory Visit: Payer: Self-pay

## 2020-01-19 ENCOUNTER — Encounter: Payer: Self-pay | Admitting: Emergency Medicine

## 2020-01-19 DIAGNOSIS — R109 Unspecified abdominal pain: Secondary | ICD-10-CM | POA: Diagnosis not present

## 2020-01-19 DIAGNOSIS — W108XXA Fall (on) (from) other stairs and steps, initial encounter: Secondary | ICD-10-CM | POA: Diagnosis not present

## 2020-01-19 DIAGNOSIS — O26892 Other specified pregnancy related conditions, second trimester: Secondary | ICD-10-CM | POA: Diagnosis not present

## 2020-01-19 DIAGNOSIS — W19XXXA Unspecified fall, initial encounter: Secondary | ICD-10-CM

## 2020-01-19 DIAGNOSIS — S3992XA Unspecified injury of lower back, initial encounter: Secondary | ICD-10-CM | POA: Diagnosis not present

## 2020-01-19 DIAGNOSIS — Z3A17 17 weeks gestation of pregnancy: Secondary | ICD-10-CM | POA: Diagnosis not present

## 2020-01-19 DIAGNOSIS — O9A212 Injury, poisoning and certain other consequences of external causes complicating pregnancy, second trimester: Secondary | ICD-10-CM | POA: Diagnosis not present

## 2020-01-19 LAB — URINALYSIS, COMPLETE (UACMP) WITH MICROSCOPIC
Bilirubin Urine: NEGATIVE
Glucose, UA: NEGATIVE mg/dL
Hgb urine dipstick: NEGATIVE
Ketones, ur: 5 mg/dL — AB
Nitrite: NEGATIVE
Protein, ur: 30 mg/dL — AB
Specific Gravity, Urine: 1.026 (ref 1.005–1.030)
pH: 5 (ref 5.0–8.0)

## 2020-01-19 LAB — COMPREHENSIVE METABOLIC PANEL
ALT: 16 U/L (ref 0–44)
AST: 23 U/L (ref 15–41)
Albumin: 3.4 g/dL — ABNORMAL LOW (ref 3.5–5.0)
Alkaline Phosphatase: 56 U/L (ref 38–126)
Anion gap: 7 (ref 5–15)
BUN: 7 mg/dL (ref 6–20)
CO2: 24 mmol/L (ref 22–32)
Calcium: 8.8 mg/dL — ABNORMAL LOW (ref 8.9–10.3)
Chloride: 103 mmol/L (ref 98–111)
Creatinine, Ser: 0.39 mg/dL — ABNORMAL LOW (ref 0.44–1.00)
GFR, Estimated: >60 mL/min (ref >60)
Glucose, Bld: 148 mg/dL — ABNORMAL HIGH (ref 70–99)
Potassium: 3.4 mmol/L — ABNORMAL LOW (ref 3.5–5.1)
Sodium: 134 mmol/L — ABNORMAL LOW (ref 135–145)
Total Bilirubin: 0.8 mg/dL (ref 0.3–1.2)
Total Protein: 6.5 g/dL (ref 6.5–8.1)

## 2020-01-19 LAB — HCG, QUANTITATIVE, PREGNANCY: hCG, Beta Chain, Quant, S: 40823 m[IU]/mL — ABNORMAL HIGH (ref <5)

## 2020-01-19 LAB — CBC
HCT: 30.4 % — ABNORMAL LOW (ref 36.0–46.0)
Hemoglobin: 10.4 g/dL — ABNORMAL LOW (ref 12.0–15.0)
MCH: 28.7 pg (ref 26.0–34.0)
MCHC: 34.2 g/dL (ref 30.0–36.0)
MCV: 84 fL (ref 80.0–100.0)
Platelets: 231 10*3/uL (ref 150–400)
RBC: 3.62 MIL/uL — ABNORMAL LOW (ref 3.87–5.11)
RDW: 14.4 % (ref 11.5–15.5)
WBC: 7.1 10*3/uL (ref 4.0–10.5)
nRBC: 0 % (ref 0.0–0.2)

## 2020-01-19 LAB — LIPASE, BLOOD: Lipase: 22 U/L (ref 11–51)

## 2020-01-19 NOTE — ED Provider Notes (Signed)
[facility] Emergency Department Provider Note   ____________________________________________    I have reviewed the triage vital signs and the nursing notes.   HISTORY  Chief Complaint Fall and Abdominal Cramping     HPI Wendy [name] is a 44 y.o. female who reports she is [redacted] weeks pregnant presents after a fall. Patient reports she missed a step and fell backwards onto her bottom and bumped down six steps. Overall feels well besides soreness to her tailbone, mild back discomfort. No vaginal bleeding. No nausea or vomiting. No abdominal pain, did have a few cramps better now. 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 No dizziness or lightheadedness.  Past Medical History:  Diagnosis Date  . Anemia   . Fibroid     Patient Active Problem List   Diagnosis Date Noted  . Pregnancy with history of uterine myomectomy 01/08/2020  . Encounter for supervision of high-risk pregnancy with elderly multigravida >53 years of age 49/07/2019  . Prior perinatal loss in second trimester, antepartum 12/17/2019  . Previous cesarean delivery, antepartum 12/17/2019    Past Surgical History:  Procedure Laterality Date  . CESAREAN SECTION    . EYE SURGERY Bilateral   . MYOMECTOMY ABDOMINAL APPROACH      Prior to Admission medications   Medication Sig Start Date End Date Taking? Authorizing Provider  ondansetron (ZOFRAN ODT) 4 MG disintegrating tablet Take 1 tablet (4 mg total) by mouth every 6 (six) hours as needed for nausea. 12/17/19   Anyanwu, Sallyanne Havers, MD  Prenatal Vit-Fe Fumarate-FA (PREPLUS) 27-1 MG TABS Take 1 tablet by mouth daily. 12/17/19   Anyanwu, Sallyanne Havers, MD  promethazine (PHENERGAN) 25 MG tablet Take 1 tablet (25 mg total) by mouth every 6 (six) hours as needed for nausea or vomiting. 12/17/19   Osborne Oman, MD     Allergies Patient has no known allergies.  Family History  Problem Relation Age of Onset  . Diabetes Mother      Social History Social History   Tobacco Use  . Smoking status: Never Smoker  . Smokeless tobacco: Never Used  Vaping Use  . Vaping Use: Never used  Substance Use Topics  . Alcohol use: Never  . Drug use: Never    Review of Systems  Constitutional: No fever/chills Eyes: No visual changes.  ENT: No sore throat. Cardiovascular: Denies chest pain. Respiratory: Denies shortness of breath. Gastrointestinal: As above Genitourinary: Negative for dysuria. Musculoskeletal: As above Skin: Negative for rash. Neurological: Negative for headaches   ____________________________________________   PHYSICAL EXAM:  VITAL SIGNS: ED Triage Vitals  Enc Vitals Group     BP 01/19/20 1305 (!) 98/53     Pulse Rate 01/19/20 1305 92     Resp 01/19/20 1305 16     Temp 01/19/20 1305 98 F (36.7 C)     Temp Source 01/19/20 1305 Oral     SpO2 01/19/20 1305 97 %     Weight 01/19/20 1302 49 kg (108 lb)     Height 01/19/20 1302 1.626 m (5\' 4" )     Head Circumference --      Peak Flow --      Pain Score 01/19/20 1302 7     Pain Loc --      Pain Edu? --      Excl. in [place]? --     Constitutional: Alert and oriented. No acute distress.   Nose: No congestion/rhinnorhea. Mouth/Throat: Mucous membranes are moist.  Neck:  Painless ROM Cardiovascular: Normal rate, regular rhythm. Grossly normal heart sounds.  Good peripheral circulation. Respiratory: Normal respiratory effort.  No retractions. Lungs CTAB. Gastrointestinal: Soft and nontender. No distention.   Musculoskeletal: Normal range of motion of all extremities. Back: No vertebral tenderness palpation, no bruising or abnormalities. Neurologic:  Normal speech and language. No gross focal neurologic deficits are appreciated.  Skin:  Skin is warm, dry and intact. No rash noted. Psychiatric: Mood and affect are normal. Speech and behavior are normal.  ____________________________________________   LABS (all labs ordered are listed, but  only abnormal results are displayed)  Labs Reviewed  COMPREHENSIVE METABOLIC PANEL - Abnormal; Notable for the following components:      Result Value   Sodium 134 (*)    Potassium 3.4 (*)    Glucose, Bld 148 (*)    Creatinine, Ser 0.39 (*)    Calcium 8.8 (*)    Albumin 3.4 (*)    All other components within normal limits  CBC - Abnormal; Notable for the following components:   RBC 3.62 (*)    Hemoglobin 10.4 (*)    HCT 30.4 (*)    All other components within normal limits  URINALYSIS, COMPLETE (UACMP) WITH MICROSCOPIC - Abnormal; Notable for the following components:   Color, Urine AMBER (*)    APPearance HAZY (*)    Ketones, ur 5 (*)    Protein, ur 30 (*)    Leukocytes,Ua TRACE (*)    Bacteria, UA RARE (*)    All other components within normal limits  HCG, QUANTITATIVE, PREGNANCY - Abnormal; Notable for the following components:   hCG, Beta Chain, Quant, S 40,823 (*)    All other components within normal limits  LIPASE, BLOOD   ____________________________________________  EKG  None ____________________________________________  RADIOLOGY  EMBU: HR 152, actively moving, fetus, no abnormal fluid collections ____________________________________________   PROCEDURES  Procedure(s) performed: No  Procedures   Critical Care performed: No ____________________________________________   INITIAL IMPRESSION / ASSESSMENT AND PLAN / ED COURSE  Pertinent labs & imaging results that were available during my care of the patient were reviewed by me and considered in my medical decision making (see chart for details).  Patient well-appearing and in no acute distress, ambulating without difficulty, blood pressure is in line with prior levels. She is drinking well, still having some morning sickness but tolerating fluids without difficulty. Exam is overall quite reassuring, possible tailbone contusion not consistent with fracture. Musculoskeletal injuries primarily, recommend  Tylenol, rest  EMBU reassuring, recommend rest x2 days, Tylenol outpatient follow-up with OB/GYN as needed    ____________________________________________   FINAL CLINICAL IMPRESSION(S) / ED DIAGNOSES  Final diagnoses:  Injury of coccyx, initial encounter  Fall, initial encounter  Abdominal pain during pregnancy in second trimester        Note:  This document was prepared using Dragon voice recognition software and may include unintentional dictation errors.   Lavonia Drafts, MD 01/19/20 1601

## 2020-01-19 NOTE — ED Triage Notes (Addendum)
Pt arrived via POV with reports of missing a step and fell down 6 steps and is now having abdominal cramping.   Pt states she did not fall onto her stomach, pt states she is [redacted] weeks pregnant.  Denies any vaginal bleeding.  G-4 P-1   Pt goes to [facility] for prenatal care

## 2020-01-28 ENCOUNTER — Encounter: Payer: Self-pay | Admitting: *Deleted

## 2020-01-28 ENCOUNTER — Other Ambulatory Visit: Payer: Self-pay

## 2020-01-28 ENCOUNTER — Other Ambulatory Visit: Payer: Self-pay | Admitting: *Deleted

## 2020-01-28 ENCOUNTER — Ambulatory Visit: Payer: Managed Care, Other (non HMO) | Attending: Obstetrics & Gynecology

## 2020-01-28 ENCOUNTER — Ambulatory Visit: Payer: Managed Care, Other (non HMO) | Admitting: *Deleted

## 2020-01-28 DIAGNOSIS — O09529 Supervision of elderly multigravida, unspecified trimester: Secondary | ICD-10-CM

## 2020-01-28 DIAGNOSIS — O09522 Supervision of elderly multigravida, second trimester: Secondary | ICD-10-CM

## 2020-01-28 DIAGNOSIS — O34212 Maternal care for vertical scar from previous cesarean delivery: Secondary | ICD-10-CM | POA: Diagnosis not present

## 2020-01-28 DIAGNOSIS — Z3A11 11 weeks gestation of pregnancy: Secondary | ICD-10-CM

## 2020-01-28 DIAGNOSIS — O34219 Maternal care for unspecified type scar from previous cesarean delivery: Secondary | ICD-10-CM | POA: Diagnosis not present

## 2020-01-28 DIAGNOSIS — O09292 Supervision of pregnancy with other poor reproductive or obstetric history, second trimester: Secondary | ICD-10-CM | POA: Insufficient documentation

## 2020-01-28 DIAGNOSIS — Z363 Encounter for antenatal screening for malformations: Secondary | ICD-10-CM | POA: Insufficient documentation

## 2020-01-28 DIAGNOSIS — Z3A19 19 weeks gestation of pregnancy: Secondary | ICD-10-CM | POA: Insufficient documentation

## 2020-01-28 NOTE — Progress Notes (Signed)
C/o" abd cramping"

## 2020-02-05 ENCOUNTER — Emergency Department: Payer: Managed Care, Other (non HMO)

## 2020-02-05 ENCOUNTER — Emergency Department
Admission: EM | Admit: 2020-02-05 | Discharge: 2020-02-06 | Disposition: A | Discharge status: Home / Self Care | Payer: Managed Care, Other (non HMO) | Attending: Emergency Medicine | Admitting: Emergency Medicine

## 2020-02-05 ENCOUNTER — Other Ambulatory Visit: Payer: Self-pay

## 2020-02-05 ENCOUNTER — Encounter: Payer: Self-pay | Admitting: Emergency Medicine

## 2020-02-05 DIAGNOSIS — Z3A19 19 weeks gestation of pregnancy: Secondary | ICD-10-CM | POA: Insufficient documentation

## 2020-02-05 DIAGNOSIS — M549 Dorsalgia, unspecified: Secondary | ICD-10-CM | POA: Insufficient documentation

## 2020-02-05 DIAGNOSIS — O26892 Other specified pregnancy related conditions, second trimester: Secondary | ICD-10-CM | POA: Insufficient documentation

## 2020-02-05 DIAGNOSIS — R1030 Lower abdominal pain, unspecified: Secondary | ICD-10-CM

## 2020-02-05 DIAGNOSIS — R109 Unspecified abdominal pain: Secondary | ICD-10-CM | POA: Diagnosis not present

## 2020-02-05 DIAGNOSIS — O99891 Other specified diseases and conditions complicating pregnancy: Secondary | ICD-10-CM | POA: Insufficient documentation

## 2020-02-05 DIAGNOSIS — O26899 Other specified pregnancy related conditions, unspecified trimester: Secondary | ICD-10-CM

## 2020-02-05 DIAGNOSIS — O09522 Supervision of elderly multigravida, second trimester: Secondary | ICD-10-CM | POA: Insufficient documentation

## 2020-02-05 LAB — COMPREHENSIVE METABOLIC PANEL
ALT: 18 U/L (ref 0–44)
AST: 26 U/L (ref 15–41)
Albumin: 3.4 g/dL — ABNORMAL LOW (ref 3.5–5.0)
Alkaline Phosphatase: 61 U/L (ref 38–126)
Anion gap: 9 (ref 5–15)
BUN: 8 mg/dL (ref 6–20)
CO2: 25 mmol/L (ref 22–32)
Calcium: 9.1 mg/dL (ref 8.9–10.3)
Chloride: 102 mmol/L (ref 98–111)
Creatinine, Ser: 0.31 mg/dL — ABNORMAL LOW (ref 0.44–1.00)
GFR, Estimated: >60 mL/min (ref >60)
Glucose, Bld: 98 mg/dL (ref 70–99)
Potassium: 3.7 mmol/L (ref 3.5–5.1)
Sodium: 136 mmol/L (ref 135–145)
Total Bilirubin: 0.6 mg/dL (ref 0.3–1.2)
Total Protein: 6.8 g/dL (ref 6.5–8.1)

## 2020-02-05 LAB — URINALYSIS, COMPLETE (UACMP) WITH MICROSCOPIC
Bilirubin Urine: NEGATIVE
Glucose, UA: NEGATIVE mg/dL
Hgb urine dipstick: NEGATIVE
Ketones, ur: NEGATIVE mg/dL
Nitrite: NEGATIVE
Protein, ur: NEGATIVE mg/dL
Specific Gravity, Urine: 1.008 (ref 1.005–1.030)
pH: 7 (ref 5.0–8.0)

## 2020-02-05 LAB — WET PREP, GENITAL
Clue Cells Wet Prep HPF POC: NONE SEEN
Sperm: NONE SEEN
Trich, Wet Prep: NONE SEEN
Yeast Wet Prep HPF POC: NONE SEEN

## 2020-02-05 LAB — CBC
HCT: 33.9 % — ABNORMAL LOW (ref 36.0–46.0)
Hemoglobin: 11.2 g/dL — ABNORMAL LOW (ref 12.0–15.0)
MCH: 28.9 pg (ref 26.0–34.0)
MCHC: 33 g/dL (ref 30.0–36.0)
MCV: 87.4 fL (ref 80.0–100.0)
Platelets: 241 10*3/uL (ref 150–400)
RBC: 3.88 MIL/uL (ref 3.87–5.11)
RDW: 15.4 % (ref 11.5–15.5)
WBC: 7.1 10*3/uL (ref 4.0–10.5)
nRBC: 0 % (ref 0.0–0.2)

## 2020-02-05 LAB — LIPASE, BLOOD: Lipase: 28 U/L (ref 11–51)

## 2020-02-05 LAB — HCG, QUANTITATIVE, PREGNANCY: hCG, Beta Chain, Quant, S: 23960 m[IU]/mL — ABNORMAL HIGH (ref <5)

## 2020-02-05 MED ORDER — SODIUM CHLORIDE 0.9 % IV BOLUS
1000.0000 mL | Freq: Once | INTRAVENOUS | Status: AC
  Administered 2020-02-05: 1000 mL via INTRAVENOUS

## 2020-02-05 NOTE — ED Provider Notes (Signed)
[facility] Emergency Department Provider Note   ____________________________________________   First MD Initiated Contact with Patient 02/05/20 2115     (approximate)  I have reviewed the triage vital signs and the nursing notes.   HISTORY  Chief Complaint Abdominal Cramping and Back Pain    HPI Wendy [name] is a 44 y.o. female who is [redacted] weeks pregnant and complains of abdominal cramping and back pain.  She is feeling fetal movement.  This is her fourth pregnancy.  She has 1 child and 2 miscarriages.  She is not having any other complaints dysuria fever shortness of breath etc.  Understands symptoms started earlier today.         Past Medical History:  Diagnosis Date  . Anemia   . Fibroid     Patient Active Problem List   Diagnosis Date Noted  . Pregnancy with history of uterine myomectomy 01/08/2020  . Encounter for supervision of high-risk pregnancy with elderly multigravida >82 years of age 14/07/2019  . Prior perinatal loss in second trimester, antepartum 12/17/2019  . Previous cesarean delivery, antepartum 12/17/2019    Past Surgical History:  Procedure Laterality Date  . CESAREAN SECTION    . EYE SURGERY Bilateral   . MYOMECTOMY ABDOMINAL APPROACH      Prior to Admission medications   Medication Sig Start Date End Date Taking? Authorizing Provider  ondansetron (ZOFRAN ODT) 4 MG disintegrating tablet Take 1 tablet (4 mg total) by mouth every 6 (six) hours as needed for nausea. 12/17/19   Anyanwu, Sallyanne Havers, MD  Prenatal Vit-Fe Fumarate-FA (PREPLUS) 27-1 MG TABS Take 1 tablet by mouth daily. 12/17/19   Anyanwu, Sallyanne Havers, MD  promethazine (PHENERGAN) 25 MG tablet Take 1 tablet (25 mg total) by mouth every 6 (six) hours as needed for nausea or vomiting. 12/17/19   Osborne Oman, MD    Allergies Patient has no known allergies.  Family History  Problem Relation Age of Onset  . Diabetes Mother     Social History Social History    Tobacco Use  . Smoking status: Never Smoker  . Smokeless tobacco: Never Used  Vaping Use  . Vaping Use: Never used  Substance Use Topics  . Alcohol use: Never  . Drug use: Never    Review of Systems  Constitutional: No fever/chills Eyes: No visual changes. ENT: No sore throat. Cardiovascular: Denies chest pain. Respiratory: Denies shortness of breath. Gastrointestinal: No abdominal pain.  No nausea, no vomiting.  No diarrhea.  No constipation. Genitourinary: Negative for dysuria. Musculoskeletal: Rampey back pain. Skin: Negative for rash. Neurological: Negative for headaches, focal weakness   ____________________________________________   PHYSICAL EXAM:  VITAL SIGNS: ED Triage Vitals  Enc Vitals Group     BP 02/05/20 1716 106/61     Pulse Rate 02/05/20 1716 90     Resp 02/05/20 1716 16     Temp 02/05/20 1716 99.1 F (37.3 C)     Temp Source 02/05/20 1716 Oral     SpO2 02/05/20 1716 100 %     Weight 02/05/20 1717 110 lb (49.9 kg)     Height 02/05/20 1717 5\' 4"  (1.626 m)     Head Circumference --      Peak Flow --      Pain Score 02/05/20 1717 8     Pain Loc --      Pain Edu? --      Excl. in [place]? --     Constitutional: Alert and oriented.  Well appearing and in no acute distress. Eyes: Conjunctivae are normal.  Head: Atraumatic. Nose: No congestion/rhinnorhea. Mouth/Throat: Mucous membranes are moist.  Oropharynx non-erythematous. Neck: No stridor Cardiovascular: Normal rate, regular rhythm. Grossly normal heart sounds.  Good peripheral circulation. Respiratory: Normal respiratory effort.  No retractions. Lungs CTAB. Gastrointestinal: Soft and nontender. No distention. No abdominal bruits. No CVA tenderness. Musculoskeletal: No lower extremity tenderness nor edema.   Neurologic:  Normal speech and language. No gross focal neurologic deficits are appreciated.  Skin:  Skin is warm, dry and intact. No rash noted. GYN: Normal perineum.  No bleeding in the  vagina.  There is a scant whitish discharge.  Cervix is long and closed.  There is no cervical motion tenderness or adnexal masses.  Uterus size appears appropriate for dates  ____________________________________________   LABS (all labs ordered are listed, but only abnormal results are displayed)  Labs Reviewed  WET PREP, GENITAL - Abnormal; Notable for the following components:      Result Value   WBC, Wet Prep HPF POC MODERATE (*)    All other components within normal limits  COMPREHENSIVE METABOLIC PANEL - Abnormal; Notable for the following components:   Creatinine, Ser 0.31 (*)    Albumin 3.4 (*)    All other components within normal limits  CBC - Abnormal; Notable for the following components:   Hemoglobin 11.2 (*)    HCT 33.9 (*)    All other components within normal limits  URINALYSIS, COMPLETE (UACMP) WITH MICROSCOPIC - Abnormal; Notable for the following components:   Color, Urine YELLOW (*)    APPearance CLEAR (*)    Leukocytes,Ua MODERATE (*)    Bacteria, UA RARE (*)    All other components within normal limits  HCG, QUANTITATIVE, PREGNANCY - Abnormal; Notable for the following components:   hCG, Beta Chain, Quant, S 23,960 (*)    All other components within normal limits  URINE CULTURE  CHLAMYDIA/NGC RT PCR ([facility] ONLY)  URINE CULTURE  LIPASE, BLOOD   ____________________________________________  EKG   ____________________________________________  RADIOLOGY Gertha Calkin, personally viewed and evaluated these images (plain radiographs) as part of my medical decision making, as well as reviewing the written report by the radiologist.  ED MD interpretation  Official radiology report(s): US OB Limited  Result Date: 02/05/2020 CLINICAL DATA:  Lower abdominal cramping EXAM: LIMITED OBSTETRIC ULTRASOUND FINDINGS: Number of Fetuses: 1 Heart Rate:  157 bpm Movement: Yes Presentation: Breech Placental Location: Right lateral Previa: No Amniotic  Fluid (Subjective):  Within normal limits. BPD: 4.6 cm 20 w  0 d MATERNAL FINDINGS: Cervix:  Appears closed. Uterus/Adnexae: No abnormality visualized.  Right ovary is not seen. IMPRESSION: Single viable intrauterine pregnancy as above. Otherwise negative limited obstetrical ultrasound. This exam is performed on an emergent basis and does not comprehensively evaluate fetal size, dating, or anatomy; follow-up complete OB US should be considered if further fetal assessment is warranted. Electronically Signed   By: Donavan Foil M.D.   On: 02/05/2020 23:01    ____________________________________________   PROCEDURES  Procedure(s) performed (including Critical Care):  Procedures   ____________________________________________  INITIAL IMPRESSION / ASSESSMENT AND PLAN / ED COURSE  ----------------------------------------- 11:44 PM on 02/05/2020 -----------------------------------------  Patient reports she still cramping just a little bit but the cramping frequency and intensity is much decreased. Her quantitative pregnancy test has gone from 40,000 down to 23 900.  She is a positive.  She has moderate WBCs on her wet prep otherwise wet prep  is negative and she has 6-10 WBCs in her urine with rare bacteria.  She sees OB/GYN at [facility].  I do not have their phone number I will try to call Dr. Amalia Hailey who is on-call for unassigned patients here.   ----------------------------------------- 11:59 PM on 02/05/2020 -----------------------------------------  Discussed patient with Dr. Amalia Hailey.  Reviewed ultrasound and lab work etc.  He recommends getting cultures and not treating with antibiotics at this point.  We will do that.  Patient not having any dysuria.  Patient is cramping is better now although there is still some small amount present.  Dr. Amalia Hailey recommends sending the patient home.  I will have her go on to do light activity almost bedrest but not quite.  I will have her follow-up with her  OB GYN very soon.  She will return if she has any worsening at all of anything including bleeding or fluid loss or worsening cramping or fever.          ____________________________________________   FINAL CLINICAL IMPRESSION(S) / ED DIAGNOSES  Final diagnoses:  Cramping affecting pregnancy, antepartum     ED Discharge Orders    None      *Please note:  Wendy [name] was evaluated in Emergency Department on 02/05/2020 for the symptoms described in the history of present illness. She was evaluated in the context of the global COVID-19 pandemic, which necessitated consideration that the patient might be at risk for infection with the SARS-CoV-2 virus that causes COVID-19. Institutional protocols and algorithms that pertain to the evaluation of patients at risk for COVID-19 are in a state of rapid change based on information released by regulatory bodies including the CDC and federal and state organizations. These policies and algorithms were followed during the patient's care in the ED.  Some ED evaluations and interventions may be delayed as a result of limited staffing during and the pandemic.*   Note:  This document was prepared using Dragon voice recognition software and may include unintentional dictation errors.    Nena Polio, MD 02/06/20 0000

## 2020-02-05 NOTE — Discharge Instructions (Signed)
I discussed your case and your lab work and ultrasound with Dr. Amalia Hailey on call here for OB/GYN.  We will get a urine culture to see if there is any UTI going on but currently he feels we should let you go home and rest and take it easy.  Please return if you have worsening of the cramping again or any fluid leakage or fever or vomiting or any other complaints.  Please give your OB/GYN doctor a call in the morning.  There should be someone on call for them even though it is Thanksgiving.

## 2020-02-05 NOTE — ED Triage Notes (Signed)
Pt comes into the ED via POV c/o abdominal cramping and back pain.  Pt currently is 19 weeks and 3 days pregnant.  Denies any vaginal bleeding and the patient is still feeling fetal movement.   Pt in NAD at this time with even and unlabored respirations.

## 2020-02-06 LAB — CHLAMYDIA/NGC RT PCR (ARMC ONLY)
Chlamydia Tr: NOT DETECTED
N gonorrhoeae: NOT DETECTED

## 2020-02-06 NOTE — ED Notes (Signed)
EDP at bedside. Pt to be discharged after IVF infused completely. Pt updated.

## 2020-02-06 NOTE — ED Notes (Signed)
Pt alert and oriented X 4, stable for discharge. RR even and unlabored, color WNL. Discussed discharge instructions and follow-up as directed. Discharge medications discussed if provided. Pt had opportunity to ask questions if necessary and RN to provide patient/family eduction.  

## 2020-02-07 LAB — URINE CULTURE: Culture: NO GROWTH

## 2020-02-12 ENCOUNTER — Ambulatory Visit (INDEPENDENT_AMBULATORY_CARE_PROVIDER_SITE_OTHER): Payer: Managed Care, Other (non HMO) | Admitting: Family Medicine

## 2020-02-12 ENCOUNTER — Other Ambulatory Visit: Payer: Self-pay

## 2020-02-12 ENCOUNTER — Encounter: Payer: Self-pay | Admitting: Family Medicine

## 2020-02-12 VITALS — BP 96/59 | HR 96 | Wt 112.0 lb

## 2020-02-12 DIAGNOSIS — O09292 Supervision of pregnancy with other poor reproductive or obstetric history, second trimester: Secondary | ICD-10-CM

## 2020-02-12 DIAGNOSIS — O09529 Supervision of elderly multigravida, unspecified trimester: Secondary | ICD-10-CM

## 2020-02-12 DIAGNOSIS — O34219 Maternal care for unspecified type scar from previous cesarean delivery: Secondary | ICD-10-CM

## 2020-02-12 DIAGNOSIS — O3429 Maternal care due to uterine scar from other previous surgery: Secondary | ICD-10-CM

## 2020-02-12 NOTE — Progress Notes (Signed)
   PRENATAL VISIT NOTE  Subjective:  Wendy [name] is a 44 y.o. [id number] at [redacted]w[redacted]d being seen today for ongoing prenatal care.  She is currently monitored for the following issues for this high-risk pregnancy and has Encounter for supervision of high-risk pregnancy with elderly multigravida >67 years of age; Prior perinatal loss in second trimester, antepartum; Previous cesarean delivery, antepartum; and Pregnancy with history of uterine myomectomy on their problem list.  Patient reports backache.  Contractions: Irritability. Vag. Bleeding: None.  Movement: Present. Denies leaking of fluid.   The following portions of the patient's history were reviewed and updated as appropriate: allergies, current medications, past family history, past medical history, past social history, past surgical history and problem list.   Objective:   Vitals:   02/12/20 1513  BP: (!) 96/59  Pulse: 96  Weight: 112 lb (50.8 kg)    Fetal Status: Fetal Heart Rate (bpm): 158   Movement: Present     General:  Alert, oriented and cooperative. Patient is in no acute distress.  Skin: Skin is warm and dry. No rash noted.   Cardiovascular: Normal heart rate noted  Respiratory: Normal respiratory effort, no problems with respiration noted  Abdomen: Soft, gravid, appropriate for gestational age.  Pain/Pressure: Present     Pelvic: Cervical exam deferred        Extremities: Normal range of motion.  Edema: None  Mental Status: Normal mood and affect. Normal behavior. Normal judgment and thought content.   Assessment and Plan:  Pregnancy: [id number] at [redacted]w[redacted]d  1. Encounter for supervision of high-risk pregnancy with elderly multigravida >87 years of age Back pain- reviewed comfort measures  2. Prior perinatal loss in second trimester, antepartum  3. Previous cesarean delivery, antepartum Plan for repeat  4. Pregnancy with history of uterine myomectomy Will require CS 37-38wks per MFM  Preterm labor symptoms and general  obstetric precautions including but not limited to vaginal bleeding, contractions, leaking of fluid and fetal movement were reviewed in detail with the patient. Please refer to After Visit Summary for other counseling recommendations.   Return in about 4 weeks (around 03/11/2020) for Routine prenatal care.  Future Appointments  Date Time Provider [place]  02/28/2020 12:45 PM [facility] NURSE [facility]  02/28/2020  1:00 PM [facility] US1 [facility]  03/11/2020  3:00 PM Caren Macadam, MD [facility]  03/27/2020  3:30 PM [facility] NURSE [facility]  03/27/2020  3:45 PM [facility] US5 [facility] [place]    Caren Macadam, MD

## 2020-02-14 ENCOUNTER — Ambulatory Visit: Payer: Managed Care, Other (non HMO)

## 2020-02-28 ENCOUNTER — Encounter: Payer: Self-pay | Admitting: *Deleted

## 2020-02-28 ENCOUNTER — Other Ambulatory Visit: Payer: Self-pay

## 2020-02-28 ENCOUNTER — Ambulatory Visit (HOSPITAL_BASED_OUTPATIENT_CLINIC_OR_DEPARTMENT_OTHER): Payer: Managed Care, Other (non HMO)

## 2020-02-28 ENCOUNTER — Ambulatory Visit: Payer: Managed Care, Other (non HMO) | Attending: Obstetrics and Gynecology | Admitting: *Deleted

## 2020-02-28 ENCOUNTER — Other Ambulatory Visit: Payer: Self-pay | Admitting: Maternal & Fetal Medicine

## 2020-02-28 DIAGNOSIS — O09522 Supervision of elderly multigravida, second trimester: Secondary | ICD-10-CM | POA: Insufficient documentation

## 2020-02-28 DIAGNOSIS — Z3A23 23 weeks gestation of pregnancy: Secondary | ICD-10-CM

## 2020-02-28 DIAGNOSIS — Z3686 Encounter for antenatal screening for cervical length: Secondary | ICD-10-CM

## 2020-02-28 DIAGNOSIS — O34219 Maternal care for unspecified type scar from previous cesarean delivery: Secondary | ICD-10-CM | POA: Diagnosis not present

## 2020-02-28 DIAGNOSIS — Z363 Encounter for antenatal screening for malformations: Secondary | ICD-10-CM

## 2020-02-28 DIAGNOSIS — O09292 Supervision of pregnancy with other poor reproductive or obstetric history, second trimester: Secondary | ICD-10-CM | POA: Diagnosis not present

## 2020-02-28 DIAGNOSIS — O09529 Supervision of elderly multigravida, unspecified trimester: Secondary | ICD-10-CM

## 2020-03-11 ENCOUNTER — Ambulatory Visit (INDEPENDENT_AMBULATORY_CARE_PROVIDER_SITE_OTHER): Payer: Managed Care, Other (non HMO) | Admitting: Family Medicine

## 2020-03-11 ENCOUNTER — Other Ambulatory Visit: Payer: Self-pay

## 2020-03-11 ENCOUNTER — Other Ambulatory Visit: Payer: Self-pay | Admitting: Family Medicine

## 2020-03-11 VITALS — BP 95/61 | HR 98 | Wt 113.4 lb

## 2020-03-11 DIAGNOSIS — R42 Dizziness and giddiness: Secondary | ICD-10-CM

## 2020-03-11 DIAGNOSIS — O3429 Maternal care due to uterine scar from other previous surgery: Secondary | ICD-10-CM

## 2020-03-11 DIAGNOSIS — N898 Other specified noninflammatory disorders of vagina: Secondary | ICD-10-CM

## 2020-03-11 DIAGNOSIS — O09529 Supervision of elderly multigravida, unspecified trimester: Secondary | ICD-10-CM

## 2020-03-11 DIAGNOSIS — O34219 Maternal care for unspecified type scar from previous cesarean delivery: Secondary | ICD-10-CM

## 2020-03-11 NOTE — Progress Notes (Signed)
   PRENATAL VISIT NOTE  Subjective:  Wendy [name] is a 44 y.o. [id number] at [redacted]w[redacted]d being seen today for ongoing prenatal care.  She is currently monitored for the following issues for this high-risk pregnancy and has Encounter for supervision of high-risk pregnancy with elderly multigravida >11 years of age; Prior perinatal loss in second trimester, antepartum; Previous cesarean delivery, antepartum; and Pregnancy with history of uterine myomectomy on their problem list.  Patient reports yellow vaginal discharge and dizziness.  Yellow discharge- x2 weeks. No itching, irritation, odor  Dizziness: worsening, associated with tiredness.     Contractions: Not present. Vag. Bleeding: None.  Movement: Present. Denies leaking of fluid.   The following portions of the patient's history were reviewed and updated as appropriate: allergies, current medications, past family history, past medical history, past social history, past surgical history and problem list.   Objective:   Vitals:   03/11/20 1502  BP: 95/61  Pulse: 98  Weight: 113 lb 6.4 oz (51.4 kg)    Fetal Status: Fetal Heart Rate (bpm): 151   Movement: Present     General:  Alert, oriented and cooperative. Patient is in no acute distress.  Skin: Skin is warm and dry. No rash noted.   Cardiovascular: Normal heart rate noted  Respiratory: Normal respiratory effort, no problems with respiration noted  Abdomen: Soft, gravid, appropriate for gestational age.  Pain/Pressure: Present     Pelvic: Cervical exam deferred      Normal appearing vaginal mucosa. White/yellow   Extremities: Normal range of motion.  Edema: None  Mental Status: Normal mood and affect. Normal behavior. Normal judgment and thought content.   Assessment and Plan:  Pregnancy: [id number] at [redacted]w[redacted]d  1. Encounter for supervision of high-risk pregnancy with elderly multigravida >38 years of age TWG=13 lb 6.4 oz (6.078 kg) which is appropriate Failed glucose screening in last  pregnancy (19 years ago) See below for plan for vaginal discharge and dizziness.   2. Previous cesarean delivery, antepartum Repeat CS at 37-38 wk  3. Pregnancy with history of uterine myomectomy See above  4. Dizziness Was mildly anemic at prior check. Will check again today given sx and appearance of pallor - CBC; Future  5. Vaginal discharge Likely physiologic - NuSwab VG+, Candida 6sp  Preterm labor symptoms and general obstetric precautions including but not limited to vaginal bleeding, contractions, leaking of fluid and fetal movement were reviewed in detail with the patient. Please refer to After Visit Summary for other counseling recommendations.   Return in about 4 weeks (around 04/08/2020) for Routine prenatal care, in person, MD or APP.  Future Appointments  Date Time Provider Department Center  03/12/2020  4:00 PM [facility] LAB [facility]  03/27/2020  2:30 PM [facility] US3 [facility]  03/27/2020  3:30 PM [facility] NURSE [facility]  04/09/2020  9:00 AM [place] Bing, MD [facility]    Federico Flake, MD

## 2020-03-12 ENCOUNTER — Other Ambulatory Visit (INDEPENDENT_AMBULATORY_CARE_PROVIDER_SITE_OTHER): Payer: Managed Care, Other (non HMO)

## 2020-03-12 ENCOUNTER — Other Ambulatory Visit: Payer: Self-pay

## 2020-03-12 DIAGNOSIS — R42 Dizziness and giddiness: Secondary | ICD-10-CM

## 2020-03-12 DIAGNOSIS — O09529 Supervision of elderly multigravida, unspecified trimester: Secondary | ICD-10-CM

## 2020-03-12 LAB — CBC
Hematocrit: 30.1 % — ABNORMAL LOW (ref 34.0–46.6)
Hemoglobin: 10 g/dL — ABNORMAL LOW (ref 11.1–15.9)
MCH: 28.7 pg (ref 26.6–33.0)
MCHC: 33.2 g/dL (ref 31.5–35.7)
MCV: 87 fL (ref 79–97)
Platelets: 229 10*3/uL (ref 150–450)
RBC: 3.48 x10E6/uL — ABNORMAL LOW (ref 3.77–5.28)
RDW: 14 % (ref 11.7–15.4)
WBC: 5.8 10*3/uL (ref 3.4–10.8)

## 2020-03-15 MED ORDER — FERROUS SULFATE 325 (65 FE) MG PO TABS: 325.0000 mg | ORAL_TABLET | Freq: Two times a day (BID) | ORAL | 1 refills | Status: DC

## 2020-03-15 NOTE — Progress Notes (Signed)
   PRENATAL VISIT NOTE  Subjective:  Wendy [name] is a 45 y.o. [id number] at [redacted]w[redacted]d being seen today for ongoing prenatal care.  She is currently monitored for the following issues for this high-risk pregnancy and has Encounter for supervision of high-risk pregnancy with elderly multigravida >44 years of age; Prior perinatal loss in second trimester, antepartum; Previous cesarean delivery, antepartum; and Pregnancy with history of uterine myomectomy on their problem list.  Patient reports fatigue. Baby is moving, denies pain/pressure and contractions. Denies bleeding. Denies leaking of fluid.   The following portions of the patient's history were reviewed and updated as appropriate: allergies, current medications, past family history, past medical history, past social history, past surgical history and problem list.   Objective:  There were no vitals filed for this visit.  Fetal Status:          FHR 151, FH 25  General:  Alert, oriented and cooperative. Patient is in no acute distress.  Skin: Skin is warm and dry. No rash noted.   Cardiovascular: Normal heart rate noted  Respiratory: Normal respiratory effort, no problems with respiration noted  Abdomen: Soft, gravid, appropriate for gestational age.        Pelvic: Cervical exam deferred        Extremities: Normal range of motion.     Mental Status: Normal mood and affect. Normal behavior. Normal judgment and thought content.   Assessment and Plan:  Pregnancy: [id number] at [redacted]w[redacted]d 1. Dizziness - CBC  2. Encounter for supervision of high-risk pregnancy with elderly multigravida Up to date Having fatigue and dizziness. Checking cbc given mild anemic prior.  Reviewed plan for next visit with glucola etc.     Preterm labor symptoms and general obstetric precautions including but not limited to vaginal bleeding, contractions, leaking of fluid and fetal movement were reviewed in detail with the patient. Please refer to After Visit Summary for  other counseling recommendations.   No follow-ups on file.  Future Appointments  Date Time Provider Department Center  03/27/2020  2:30 PM [facility] US3 [facility]  03/27/2020  3:30 PM [facility] NURSE [facility]  04/09/2020  9:00 AM [place] Bing, MD [facility]    Federico Flake, MD

## 2020-03-16 LAB — NUSWAB VG+, CANDIDA 6SP
C PARAPSILOSIS/TROPICALIS: NEGATIVE
Candida albicans, NAA: NEGATIVE
Candida glabrata, NAA: NEGATIVE
Candida krusei, NAA: NEGATIVE
Candida lusitaniae, NAA: NEGATIVE
Chlamydia trachomatis, NAA: NEGATIVE
Neisseria gonorrhoeae, NAA: NEGATIVE
Trich vag by NAA: NEGATIVE

## 2020-03-27 ENCOUNTER — Ambulatory Visit (HOSPITAL_BASED_OUTPATIENT_CLINIC_OR_DEPARTMENT_OTHER): Payer: Managed Care, Other (non HMO)

## 2020-03-27 ENCOUNTER — Ambulatory Visit: Payer: Managed Care, Other (non HMO) | Attending: Obstetrics and Gynecology | Admitting: *Deleted

## 2020-03-27 ENCOUNTER — Ambulatory Visit: Payer: Managed Care, Other (non HMO)

## 2020-03-27 ENCOUNTER — Other Ambulatory Visit: Payer: Self-pay

## 2020-03-27 DIAGNOSIS — O09522 Supervision of elderly multigravida, second trimester: Secondary | ICD-10-CM | POA: Insufficient documentation

## 2020-03-27 DIAGNOSIS — O34219 Maternal care for unspecified type scar from previous cesarean delivery: Secondary | ICD-10-CM | POA: Insufficient documentation

## 2020-03-27 DIAGNOSIS — O09292 Supervision of pregnancy with other poor reproductive or obstetric history, second trimester: Secondary | ICD-10-CM | POA: Diagnosis not present

## 2020-03-27 DIAGNOSIS — O09529 Supervision of elderly multigravida, unspecified trimester: Secondary | ICD-10-CM

## 2020-03-27 DIAGNOSIS — Z3686 Encounter for antenatal screening for cervical length: Secondary | ICD-10-CM | POA: Insufficient documentation

## 2020-03-27 DIAGNOSIS — Z363 Encounter for antenatal screening for malformations: Secondary | ICD-10-CM | POA: Insufficient documentation

## 2020-03-27 DIAGNOSIS — Z3A27 27 weeks gestation of pregnancy: Secondary | ICD-10-CM

## 2020-03-27 DIAGNOSIS — O321XX0 Maternal care for breech presentation, not applicable or unspecified: Secondary | ICD-10-CM | POA: Diagnosis not present

## 2020-03-31 ENCOUNTER — Other Ambulatory Visit: Payer: Self-pay | Admitting: *Deleted

## 2020-03-31 DIAGNOSIS — O43192 Other malformation of placenta, second trimester: Secondary | ICD-10-CM

## 2020-04-09 ENCOUNTER — Other Ambulatory Visit: Payer: Self-pay

## 2020-04-09 ENCOUNTER — Ambulatory Visit (INDEPENDENT_AMBULATORY_CARE_PROVIDER_SITE_OTHER): Payer: Managed Care, Other (non HMO) | Admitting: Obstetrics and Gynecology

## 2020-04-09 VITALS — BP 97/60 | HR 86 | Wt 114.8 lb

## 2020-04-09 DIAGNOSIS — O3429 Maternal care due to uterine scar from other previous surgery: Secondary | ICD-10-CM

## 2020-04-09 DIAGNOSIS — O09529 Supervision of elderly multigravida, unspecified trimester: Secondary | ICD-10-CM

## 2020-04-09 DIAGNOSIS — O34219 Maternal care for unspecified type scar from previous cesarean delivery: Secondary | ICD-10-CM

## 2020-04-09 DIAGNOSIS — Z23 Encounter for immunization: Secondary | ICD-10-CM | POA: Diagnosis not present

## 2020-04-09 DIAGNOSIS — Z3A29 29 weeks gestation of pregnancy: Secondary | ICD-10-CM

## 2020-04-09 NOTE — Progress Notes (Signed)
Prenatal Visit Note Date: 04/09/2020 Clinic: [facility]  Subjective:  Wendy [name] is a 45 y.o. [id number] at [redacted]w[redacted]d being seen today for ongoing prenatal care.  She is currently monitored for the following issues for this high-risk pregnancy and has Encounter for supervision of high-risk pregnancy with elderly multigravida >9 years of age; Prior perinatal loss in second trimester, antepartum; Previous cesarean delivery, antepartum; and Pregnancy with history of uterine myomectomy on their problem list.  Patient reports nausea and vomiting and feeling tired and low back pain and pressure..   Contractions: Irritability. Vag. Bleeding: None.  Movement: Present. Denies leaking of fluid.   The following portions of the patient's history were reviewed and updated as appropriate: allergies, current medications, past family history, past medical history, past social history, past surgical history and problem list. Problem list updated.  Objective:   Vitals:   04/09/20 0849  BP: 97/60  Pulse: 86  Weight: 114 lb 12.8 oz (52.1 kg)    Fetal Status: Fetal Heart Rate (bpm): 148   Movement: Present     General:  Alert, oriented and cooperative. Patient is in no acute distress.  Skin: Skin is warm and dry. No rash noted.   Cardiovascular: Normal heart rate noted  Respiratory: Normal respiratory effort, no problems with respiration noted  Abdomen: Soft, gravid, appropriate for gestational age. Pain/Pressure: Present     Pelvic:  Cervical exam deferred        Extremities: Normal range of motion.     Mental Status: Normal mood and affect. Normal behavior. Normal judgment and thought content.   Urinalysis:      Assessment and Plan:  Pregnancy: [name] at [redacted]w[redacted]d  1. Encounter for supervision of high-risk pregnancy with elderly multigravida Routine care. Has 2/73mfm serial growth u/s - Glucose Tolerance, 2 Hours w/1 Hour - CBC - RPR - HIV Antibody (routine testing w rflx)  2.  [redacted] weeks gestation of pregnancy  3. Pregnancy with history of uterine myomectomy Schedule for 37wks nv  4. Previous cesarean delivery, antepartum  Preterm labor symptoms and general obstetric precautions including but not limited to vaginal bleeding, contractions, leaking of fluid and fetal movement were reviewed in detail with the patient. Please refer to After Visit Summary for other counseling recommendations.  Return in about 2 weeks (around 04/23/2020) for in person, md or app.   Wendy Halim, MD

## 2020-04-10 LAB — CBC
Hematocrit: 31.9 % — ABNORMAL LOW (ref 34.0–46.6)
Hemoglobin: 10.5 g/dL — ABNORMAL LOW (ref 11.1–15.9)
MCH: 28.7 pg (ref 26.6–33.0)
MCHC: 32.9 g/dL (ref 31.5–35.7)
MCV: 87 fL (ref 79–97)
Platelets: 193 10*3/uL (ref 150–450)
RBC: 3.66 x10E6/uL — ABNORMAL LOW (ref 3.77–5.28)
RDW: 15 % (ref 11.7–15.4)
WBC: 5.2 10*3/uL (ref 3.4–10.8)

## 2020-04-10 LAB — GLUCOSE TOLERANCE, 2 HOURS W/ 1HR
Glucose, 1 hour: 153 mg/dL (ref 65–179)
Glucose, 2 hour: 181 mg/dL — ABNORMAL HIGH (ref 65–152)
Glucose, Fasting: 77 mg/dL (ref 65–91)

## 2020-04-10 LAB — HIV ANTIBODY (ROUTINE TESTING W REFLEX): HIV Screen 4th Generation wRfx: NONREACTIVE

## 2020-04-10 LAB — RPR: RPR Ser Ql: NONREACTIVE

## 2020-04-13 ENCOUNTER — Telehealth: Payer: Self-pay | Admitting: *Deleted

## 2020-04-13 ENCOUNTER — Encounter: Payer: Self-pay | Admitting: Obstetrics and Gynecology

## 2020-04-13 DIAGNOSIS — Z8632 Personal history of gestational diabetes: Secondary | ICD-10-CM | POA: Insufficient documentation

## 2020-04-13 DIAGNOSIS — O99019 Anemia complicating pregnancy, unspecified trimester: Secondary | ICD-10-CM | POA: Insufficient documentation

## 2020-04-13 DIAGNOSIS — O09529 Supervision of elderly multigravida, unspecified trimester: Secondary | ICD-10-CM

## 2020-04-13 DIAGNOSIS — O24419 Gestational diabetes mellitus in pregnancy, unspecified control: Secondary | ICD-10-CM | POA: Insufficient documentation

## 2020-04-13 DIAGNOSIS — O24410 Gestational diabetes mellitus in pregnancy, diet controlled: Secondary | ICD-10-CM

## 2020-04-13 MED ORDER — ACCU-CHEK SOFTCLIX LANCETS MISC: 1.0000 | Freq: Four times a day (QID) | 12 refills | Status: DC

## 2020-04-13 MED ORDER — FERROUS SULFATE 325 (65 FE) MG PO TABS
325.0000 mg | ORAL_TABLET | ORAL | 1 refills | Status: DC
Start: 2020-04-13 — End: 2023-12-05

## 2020-04-13 MED ORDER — ACCU-CHEK GUIDE W/DEVICE KIT: 1.0000 | PACK | Freq: Four times a day (QID) | 0 refills | Status: DC

## 2020-04-13 MED ORDER — ACCU-CHEK GUIDE VI STRP: ORAL_STRIP | 12 refills | Status: DC

## 2020-04-13 NOTE — Addendum Note (Signed)
Addended by: Aletha Halim on: 04/13/2020 12:49 PM   Modules accepted: Orders

## 2020-04-13 NOTE — Telephone Encounter (Signed)
-----   Message from Aletha Halim, MD sent at 04/13/2020 12:47 PM EST ----- She has a new GDM diagnosis. Please set her up to see DM education and send in testing supplies for her. thanks

## 2020-04-13 NOTE — Telephone Encounter (Signed)
Called pt to inform her of her results and new diagnosis of GDM. Informed that we will send in supplies for her and will refer her to diabetes and nutrition class and once she has the class she can start checking her blood sugars fasting and 2 hr pp. Pt verbalizes and understands.

## 2020-04-17 ENCOUNTER — Encounter: Payer: Managed Care, Other (non HMO) | Admitting: Nurse Practitioner

## 2020-04-23 ENCOUNTER — Other Ambulatory Visit: Payer: Self-pay

## 2020-04-23 ENCOUNTER — Ambulatory Visit (INDEPENDENT_AMBULATORY_CARE_PROVIDER_SITE_OTHER): Payer: Managed Care, Other (non HMO) | Admitting: Obstetrics and Gynecology

## 2020-04-23 VITALS — BP 95/60 | HR 96 | Wt 118.0 lb

## 2020-04-23 DIAGNOSIS — O34219 Maternal care for unspecified type scar from previous cesarean delivery: Secondary | ICD-10-CM

## 2020-04-23 DIAGNOSIS — O09529 Supervision of elderly multigravida, unspecified trimester: Secondary | ICD-10-CM

## 2020-04-23 DIAGNOSIS — O43199 Other malformation of placenta, unspecified trimester: Secondary | ICD-10-CM

## 2020-04-23 DIAGNOSIS — O24419 Gestational diabetes mellitus in pregnancy, unspecified control: Secondary | ICD-10-CM

## 2020-04-23 DIAGNOSIS — O3429 Maternal care due to uterine scar from other previous surgery: Secondary | ICD-10-CM

## 2020-04-23 NOTE — Progress Notes (Signed)
Prenatal Visit Note Date: 04/23/2020 Clinic: [facility]  Subjective:  Wendy [name] is a 45 y.o. [id number] at [redacted]w[redacted]d being seen today for ongoing prenatal care.  She is currently monitored for the following issues for this high-risk pregnancy and has Encounter for supervision of high-risk pregnancy with elderly multigravida >82 years of age; Prior perinatal loss in second trimester, antepartum; Previous cesarean delivery, antepartum; Pregnancy with history of uterine myomectomy; GDM (gestational diabetes mellitus); Anemia affecting pregnancy; and Marginal insertion of umbilical cord affecting management of mother on their problem list.  Patient reports no complaints.   Contractions: Not present. Vag. Bleeding: None.  Movement: Present. Denies leaking of fluid.   The following portions of the patient's history were reviewed and updated as appropriate: allergies, current medications, past family history, past medical history, past social history, past surgical history and problem list. Problem list updated.  Objective:   Vitals:   04/23/20 1535  BP: 95/60  Pulse: 96  Weight: 118 lb (53.5 kg)    Fetal Status: Fetal Heart Rate (bpm): 150   Movement: Present     General:  Alert, oriented and cooperative. Patient is in no acute distress.  Skin: Skin is warm and dry. No rash noted.   Cardiovascular: Normal heart rate noted  Respiratory: Normal respiratory effort, no problems with respiration noted  Abdomen: Soft, gravid, appropriate for gestational age. Pain/Pressure: Present     Pelvic:  Cervical exam deferred        Extremities: Normal range of motion.  Edema: None  Mental Status: Normal mood and affect. Normal behavior. Normal judgment and thought content.   Urinalysis:      Assessment and Plan:  Pregnancy: [id number] at [redacted]w[redacted]d  1. Encounter for supervision of high-risk pregnancy with elderly multigravida >18 years of age Wendy [name] about BTL Has growth u/s tomorrow  2.  Previous cesarean delivery, antepartum Request sent for 37wk rpt  3. Marginal insertion of umbilical cord affecting management of mother   4. Gestational diabetes mellitus (GDM) in third trimester, gestational diabetes method of control unspecified Has gdm education visit tomorrow. Has already started checking and am fasting and 2h post prandials look good except for two values in the 130s where she had brown rice. I told her write down a list of foods that may be problematic to go over with the educator  5. Pregnancy with history of uterine myomectomy  Preterm labor symptoms and general obstetric precautions including but not limited to vaginal bleeding, contractions, leaking of fluid and fetal movement were reviewed in detail with the patient. Please refer to After Visit Summary for other counseling recommendations.  Return in about 2 weeks (around 05/07/2020) for in person, md or app.   Aletha Halim, MD

## 2020-04-24 ENCOUNTER — Encounter: Payer: Self-pay | Admitting: *Deleted

## 2020-04-24 ENCOUNTER — Other Ambulatory Visit: Payer: Self-pay

## 2020-04-24 ENCOUNTER — Other Ambulatory Visit: Payer: Self-pay | Admitting: *Deleted

## 2020-04-24 ENCOUNTER — Ambulatory Visit: Payer: Managed Care, Other (non HMO) | Attending: Obstetrics

## 2020-04-24 ENCOUNTER — Ambulatory Visit: Payer: Managed Care, Other (non HMO) | Admitting: *Deleted

## 2020-04-24 ENCOUNTER — Encounter: Payer: Managed Care, Other (non HMO) | Attending: Obstetrics and Gynecology | Admitting: *Deleted

## 2020-04-24 VITALS — BP 96/60 | Ht 64.0 in | Wt 118.2 lb

## 2020-04-24 DIAGNOSIS — O09529 Supervision of elderly multigravida, unspecified trimester: Secondary | ICD-10-CM | POA: Insufficient documentation

## 2020-04-24 DIAGNOSIS — Z3A31 31 weeks gestation of pregnancy: Secondary | ICD-10-CM | POA: Diagnosis not present

## 2020-04-24 DIAGNOSIS — Z9289 Personal history of other medical treatment: Secondary | ICD-10-CM

## 2020-04-24 DIAGNOSIS — O357XX0 Maternal care for (suspected) damage to fetus by other medical procedures, not applicable or unspecified: Secondary | ICD-10-CM | POA: Diagnosis not present

## 2020-04-24 DIAGNOSIS — O09293 Supervision of pregnancy with other poor reproductive or obstetric history, third trimester: Secondary | ICD-10-CM | POA: Diagnosis not present

## 2020-04-24 DIAGNOSIS — O09523 Supervision of elderly multigravida, third trimester: Secondary | ICD-10-CM | POA: Diagnosis not present

## 2020-04-24 DIAGNOSIS — O43192 Other malformation of placenta, second trimester: Secondary | ICD-10-CM | POA: Diagnosis present

## 2020-04-24 DIAGNOSIS — O24410 Gestational diabetes mellitus in pregnancy, diet controlled: Secondary | ICD-10-CM | POA: Diagnosis not present

## 2020-04-24 DIAGNOSIS — O34219 Maternal care for unspecified type scar from previous cesarean delivery: Secondary | ICD-10-CM

## 2020-04-24 NOTE — Patient Instructions (Signed)
Read booklet on Gestational Diabetes Follow Gestational Meal Planning Guidelines Include 1 serving of protein when eating fruit for a snack Decrease portions of rice Check blood sugars 4 x day - before breakfast and 2 hrs after every meal and record  Bring blood sugar log to all MD appointments Purchase urine ketone strips if instructed by MD and check urine ketones every am:  If + increase bedtime snack to 1 protein and 2 carbohydrate servings Walk 20-30 minutes at least 5 x week if permitted by MD

## 2020-04-24 NOTE — Progress Notes (Signed)
Diabetes Self-Management Education  Visit Type: First/Initial  Appt. Start Time: 1000 Appt. End Time: 1100  04/24/2020  [name], identified by name and date of birth, is a 45 y.o. female with a diagnosis of Diabetes: Gestational Diabetes.   ASSESSMENT  Blood pressure 96/60, height 5\' 4"  (1.626 m), weight 118 lb 3.2 oz (53.6 kg), last menstrual period 09/28/2019, estimated date of delivery 06/22/2020 Body mass index is 20.29 kg/m.   Diabetes Self-Management Education - 04/24/20 1125      Visit Information   Visit Type First/Initial      Initial Visit   Diabetes Type Gestational Diabetes    Are you currently following a meal plan? Yes    What type of meal plan do you follow? "less sugary foods, less carbs"    Are you taking your medications as prescribed? Yes    Date Diagnosed two weeks      Health Coping   How would you rate your overall health? Good      Psychosocial Assessment   Patient Belief/Attitude about Diabetes Motivated to manage diabetes    Self-care barriers None    Self-management support Doctor's office;Family    Patient Concerns Nutrition/Meal planning;Glycemic Control    Special Needs None    Preferred Learning Style Visual;Auditory    Learning Readiness Change in progress    How often do you need to have someone help you when you read instructions, pamphlets, or other written materials from your doctor or pharmacy? 1 - Never    What is the last grade level you completed in school? Bachelors      Pre-Education Assessment   Patient understands the diabetes disease and treatment process. Needs Review    Patient understands incorporating nutritional management into lifestyle. Needs Instruction    Patient undertands incorporating physical activity into lifestyle. Demonstrates understanding / competency    Patient understands using medications safely. Needs Review    Patient understands monitoring blood glucose, interpreting and using results Needs  Review    Patient understands prevention, detection, and treatment of acute complications. Needs Instruction    Patient understands prevention, detection, and treatment of chronic complications. Needs Review    Patient understands how to develop strategies to address psychosocial issues. Needs Review    Patient understands how to develop strategies to promote health/change behavior. Needs Review      Complications   How often do you check your blood sugar? 3-4 times/day    Fasting Blood glucose range (mg/dL) 70-129   FBG's 69-91 mg/dL   Postprandial Blood glucose range (mg/dL) 70-129;130-179   pp's breakfast 92-110 mg/dL; pp's lunch 115-138 mg/dL 3/5 out of range due to rice intake; pp's supper 95-135 mg/dL 1/5 out of range due to rice intake   Have you had a dilated eye exam in the past 12 months? Yes    Have you had a dental exam in the past 12 months? Yes    Are you checking your feet? No      Dietary Intake   Breakfast 1/2 piece wheat bread, eggs or sardines, green salad with avocado    Snack (morning) almonds, walnuts    Lunch brown rice, fish, chicken, meat, different sauces wtih carrots, cabbage, peppers, onions, garlic, peanut butter    Snack (afternoon) [place] yogurt, apple, orange, strawberries    Dinner fish, chicken, meat; beans, corn, peas, green beans    Snack (evening) fruit or yogurt    Beverage(s) water, hot tea, milk,  Exercise   Exercise Type Light (walking / raking leaves)    How many days per week to you exercise? 7    How many minutes per day do you exercise? 30    Total minutes per week of exercise 210      Patient Education   Previous Diabetes Education No   reports having GDM in the past but only checked her blood sugars with no education   Disease state  Definition of diabetes, type 1 and 2, and the diagnosis of diabetes;Factors that contribute to the development of diabetes    Nutrition management  Role of diet in the treatment of diabetes and the  relationship between the three main macronutrients and blood glucose level;Food label reading, portion sizes and measuring food.;Reviewed blood glucose goals for pre and post meals and how to evaluate the patients' food intake on their blood glucose level.    Physical activity and exercise  Role of exercise on diabetes management, blood pressure control and cardiac health.    Medications Other (comment)   limited use of oral medications during pregnancy and potential for insulin   Monitoring Purpose and frequency of SMBG.;Taught/discussed recording of test results and interpretation of SMBG.;Ketone testing, when, how.    Chronic complications Relationship between chronic complications and blood glucose control    Psychosocial adjustment Identified and addressed patients feelings and concerns about diabetes    Preconception care Pregnancy and GDM  Role of pre-pregnancy blood glucose control on the development of the fetus;Reviewed with patient blood glucose goals with pregnancy;Role of family planning for patients with diabetes      Individualized Goals (developed by patient)   Reducing Risk Other (comment)   improve blood sugars     Outcomes   Expected Outcomes Demonstrated interest in learning. Expect positive outcomes    Future DMSE PRN    Program Status Not Completed           Individualized Plan for Diabetes Self-Management Training:   Learning Objective:  Patient will have a greater understanding of diabetes self-management. Patient education plan is to attend individual and/or group sessions per assessed needs and concerns.   Plan:   Patient Instructions  Read booklet on Gestational Diabetes Follow Gestational Meal Planning Guidelines Include 1 serving of protein when eating fruit for a snack Decrease portions of rice Check blood sugars 4 x day - before breakfast and 2 hrs after every meal and record  Bring blood sugar log to all MD appointments Purchase urine ketone strips if  instructed by MD and check urine ketones every am:  If + increase bedtime snack to 1 protein and 2 carbohydrate servings Walk 20-30 minutes at least 5 x week if permitted by MD  Expected Outcomes:  Demonstrated interest in learning. Expect positive outcomes  Education material provided:  Gestational Booklet Gestational Meal Planning Guidelines Simple Meal Plan Goals for a Healthy Pregnancy  If problems or questions, patient to contact team via:  Wendy Drilling, RN, CCM, [place] [phone]  Future DSME appointment: PRN  Patient had Gestational Diabetes in the past. She is making diet changes and will call back if she needs to follow up with a dietitian.

## 2020-04-30 ENCOUNTER — Encounter: Payer: Self-pay | Admitting: *Deleted

## 2020-05-04 ENCOUNTER — Other Ambulatory Visit: Payer: Self-pay | Admitting: Obstetrics & Gynecology

## 2020-05-05 ENCOUNTER — Other Ambulatory Visit: Payer: Self-pay

## 2020-05-05 ENCOUNTER — Ambulatory Visit (INDEPENDENT_AMBULATORY_CARE_PROVIDER_SITE_OTHER): Payer: Self-pay | Admitting: Obstetrics and Gynecology

## 2020-05-05 VITALS — BP 96/60 | HR 91 | Wt 118.4 lb

## 2020-05-05 DIAGNOSIS — O3429 Maternal care due to uterine scar from other previous surgery: Secondary | ICD-10-CM

## 2020-05-05 DIAGNOSIS — O24410 Gestational diabetes mellitus in pregnancy, diet controlled: Secondary | ICD-10-CM

## 2020-05-05 DIAGNOSIS — O09523 Supervision of elderly multigravida, third trimester: Secondary | ICD-10-CM

## 2020-05-05 DIAGNOSIS — O09529 Supervision of elderly multigravida, unspecified trimester: Secondary | ICD-10-CM

## 2020-05-05 DIAGNOSIS — Z3A33 33 weeks gestation of pregnancy: Secondary | ICD-10-CM

## 2020-05-05 DIAGNOSIS — O24419 Gestational diabetes mellitus in pregnancy, unspecified control: Secondary | ICD-10-CM

## 2020-05-05 DIAGNOSIS — O43193 Other malformation of placenta, third trimester: Secondary | ICD-10-CM

## 2020-05-05 DIAGNOSIS — O43199 Other malformation of placenta, unspecified trimester: Secondary | ICD-10-CM

## 2020-05-05 NOTE — Progress Notes (Signed)
Prenatal Visit Note Date: 05/05/2020 Clinic: [facility]  Subjective:  Wendy [name] is a 45 y.o. [id number] at [redacted]w[redacted]d being seen today for ongoing prenatal care.  She is currently monitored for the following issues for this high-risk pregnancy and has Encounter for supervision of high-risk pregnancy with elderly multigravida >68 years of age; Prior perinatal loss in second trimester, antepartum; Previous cesarean delivery, antepartum; Pregnancy with history of uterine myomectomy; GDM (gestational diabetes mellitus); Anemia affecting pregnancy; and Marginal insertion of umbilical cord affecting management of mother on their problem list.  Patient reports no complaints.   Contractions: Not present. Vag. Bleeding: None.  Movement: Present. Denies leaking of fluid.   The following portions of the patient's history were reviewed and updated as appropriate: allergies, current medications, past family history, past medical history, past social history, past surgical history and problem list. Problem list updated.  Objective:   Vitals:   05/05/20 1528  BP: 96/60  Pulse: 91  Weight: 118 lb 6.4 oz (53.7 kg)    Fetal Status: Fetal Heart Rate (bpm): 157   Movement: Present     General:  Alert, oriented and cooperative. Patient is in no acute distress.  Skin: Skin is warm and dry. No rash noted.   Cardiovascular: Normal heart rate noted  Respiratory: Normal respiratory effort, no problems with respiration noted  Abdomen: Soft, gravid, appropriate for gestational age. Pain/Pressure: Absent     Pelvic:  Cervical exam deferred        Extremities: Normal range of motion.     Mental Status: Normal mood and affect. Normal behavior. Normal judgment and thought content.   Urinalysis:      Assessment and Plan:  Pregnancy: D1S9702 at [redacted]w[redacted]d  1. [redacted] weeks gestation of pregnancy gbs nv  2. Gestational diabetes mellitus (GDM) in third trimester, gestational diabetes method of control  unspecified Great control with diet. 2/11 growth u/s normal: 45%, ac 47%, afi 17. Pt with rpt u/s on 3/11  3. Encounter for supervision of high-risk pregnancy with elderly multigravida >12 years of age To start ap testing at 36wks  4. Marginal insertion of umbilical cord affecting management of mother  5. Pregnancy with history of uterine myomectomy Scheduled for 37wk rpt c/s. Unsure about BTL  Preterm labor symptoms and general obstetric precautions including but not limited to vaginal bleeding, contractions, leaking of fluid and fetal movement were reviewed in detail with the patient. Please refer to After Visit Summary for other counseling recommendations.  Return in about 2 weeks (around 05/19/2020) for in person, md or app.   Aletha Halim, MD

## 2020-05-05 NOTE — Patient Instructions (Signed)
[facility] clinic pediatrics in [place]

## 2020-05-19 ENCOUNTER — Other Ambulatory Visit: Payer: Self-pay

## 2020-05-19 ENCOUNTER — Ambulatory Visit (INDEPENDENT_AMBULATORY_CARE_PROVIDER_SITE_OTHER): Payer: Managed Care, Other (non HMO) | Admitting: Obstetrics and Gynecology

## 2020-05-19 VITALS — BP 100/63 | HR 90 | Wt 123.0 lb

## 2020-05-19 DIAGNOSIS — O09529 Supervision of elderly multigravida, unspecified trimester: Secondary | ICD-10-CM

## 2020-05-19 DIAGNOSIS — O99013 Anemia complicating pregnancy, third trimester: Secondary | ICD-10-CM

## 2020-05-19 DIAGNOSIS — Z3A35 35 weeks gestation of pregnancy: Secondary | ICD-10-CM

## 2020-05-19 DIAGNOSIS — O24419 Gestational diabetes mellitus in pregnancy, unspecified control: Secondary | ICD-10-CM

## 2020-05-19 DIAGNOSIS — O43199 Other malformation of placenta, unspecified trimester: Secondary | ICD-10-CM

## 2020-05-19 DIAGNOSIS — O3429 Maternal care due to uterine scar from other previous surgery: Secondary | ICD-10-CM

## 2020-05-19 NOTE — Progress Notes (Signed)
Prenatal Visit Note Date: 05/19/2020 Clinic: [facility]  Subjective:  Wendy [name] is a 45 y.o. [id number] at [redacted]w[redacted]d being seen today for ongoing prenatal care.  She is currently monitored for the following issues for this high-risk pregnancy and has Encounter for supervision of high-risk pregnancy with elderly multigravida >70 years of age; Prior perinatal loss in second trimester, antepartum; Previous cesarean delivery, antepartum; Pregnancy with history of uterine myomectomy; GDM (gestational diabetes mellitus); Anemia affecting pregnancy; and Marginal insertion of umbilical cord affecting management of mother on their problem list.  Patient reports continued b/l LE edema.   Contractions: Not present. Vag. Bleeding: None.  Movement: Present. Denies leaking of fluid.   The following portions of the patient's history were reviewed and updated as appropriate: allergies, current medications, past family history, past medical history, past social history, past surgical history and problem list. Problem list updated.  Objective:   Vitals:   05/19/20 1357  BP: 100/63  Pulse: 90  Weight: 123 lb (55.8 kg)    Fetal Status: Fetal Heart Rate (bpm): 145   Movement: Present     General:  Alert, oriented and cooperative. Patient is in no acute distress.  Skin: Skin is warm and dry. No rash noted.   Cardiovascular: Normal heart rate noted  Respiratory: Normal respiratory effort, no problems with respiration noted  Abdomen: Soft, gravid, appropriate for gestational age. Pain/Pressure: Absent     Pelvic:  Cervical exam deferred        Extremities: Normal range of motion.  Edema: Mild pitting, slight indentation  Mental Status: Normal mood and affect. Normal behavior. Normal judgment and thought content.   Urinalysis:      Assessment and Plan:  Pregnancy: [id number] at [redacted]w[redacted]d  1. Encounter for supervision of high-risk pregnancy with elderly multigravida >42 years of age Starting qwk  bpp on Friday 2/11: 1834gm, 45%, ac 47% - NuSwab BV and Candida, NAA - Strep Gp B NAA  2. [redacted] weeks gestation of pregnancy  3. Marginal insertion of umbilical cord affecting management of mother See above.   4. Gestational diabetes mellitus (GDM) in third trimester, gestational diabetes method of control unspecified Normal CBG log with just diet control  5. Anemia affecting pregnancy in third trimester CBC Latest Ref Rng & Units 04/09/2020 03/12/2020 02/05/2020  WBC 3.4 - 10.8 x10E3/uL 5.2 5.8 7.1  Hemoglobin 11.1 - 15.9 g/dL 10.5(L) 10.0(L) 11.2(L)  Hematocrit 34.0 - 46.6 % 31.9(L) 30.1(L) 33.9(L)  Platelets 150 - 450 x10E3/uL 193 229 241     6. Pregnancy with history of uterine myomectomy Already scheduled for 37wk c/s on 3/24  Preterm labor symptoms and general obstetric precautions including but not limited to vaginal bleeding, contractions, leaking of fluid and fetal movement were reviewed in detail with the patient. Please refer to After Visit Summary for other counseling recommendations.  Return in about 1 week (around 05/26/2020) for md visit, in person.   Aletha Halim, MD

## 2020-05-21 ENCOUNTER — Encounter (HOSPITAL_COMMUNITY): Payer: Self-pay

## 2020-05-21 LAB — STREP GP B NAA: Strep Gp B NAA: NEGATIVE

## 2020-05-21 NOTE — Patient Instructions (Signed)
Wendy [name]  05/21/2020   Your procedure is scheduled on:  06/04/2020  Arrive at 21 at Entrance C on [address] [facility]  and [address]. You are invited to use the FREE valet parking or use the Visitor's parking deck.  Pick up the phone at the desk and dial [phone].  Call this number if you have problems the morning of surgery: [phone]  Remember:   Do not eat food:(After Midnight) Desps de medianoche.  Do not drink clear liquids: (After Midnight) Desps de medianoche.  Take these medicines the morning of surgery with A SIP OF WATER:  none   Do not wear jewelry, make-up or nail polish.  Do not wear lotions, powders, or perfumes. Do not wear deodorant.  Do not shave 48 hours prior to surgery.  Do not bring valuables to the hospital.  [facility] is not   responsible for any belongings or valuables brought to the hospital.  Contacts, dentures or bridgework may not be worn into surgery.  Leave suitcase in the car. After surgery it may be brought to your room.  For patients admitted to the hospital, checkout time is 11:00 AM the day of              discharge.      Please read over the following fact sheets that you were given:     Preparing for Surgery

## 2020-05-22 ENCOUNTER — Other Ambulatory Visit: Payer: Self-pay

## 2020-05-22 ENCOUNTER — Ambulatory Visit: Payer: Managed Care, Other (non HMO) | Admitting: *Deleted

## 2020-05-22 ENCOUNTER — Ambulatory Visit: Payer: Managed Care, Other (non HMO) | Attending: Obstetrics

## 2020-05-22 ENCOUNTER — Encounter: Payer: Self-pay | Admitting: *Deleted

## 2020-05-22 DIAGNOSIS — O09293 Supervision of pregnancy with other poor reproductive or obstetric history, third trimester: Secondary | ICD-10-CM

## 2020-05-22 DIAGNOSIS — Z362 Encounter for other antenatal screening follow-up: Secondary | ICD-10-CM | POA: Diagnosis not present

## 2020-05-22 DIAGNOSIS — O09529 Supervision of elderly multigravida, unspecified trimester: Secondary | ICD-10-CM | POA: Diagnosis not present

## 2020-05-22 DIAGNOSIS — Z3A35 35 weeks gestation of pregnancy: Secondary | ICD-10-CM

## 2020-05-22 DIAGNOSIS — O09523 Supervision of elderly multigravida, third trimester: Secondary | ICD-10-CM | POA: Diagnosis not present

## 2020-05-22 DIAGNOSIS — O24410 Gestational diabetes mellitus in pregnancy, diet controlled: Secondary | ICD-10-CM | POA: Diagnosis not present

## 2020-05-22 DIAGNOSIS — O34219 Maternal care for unspecified type scar from previous cesarean delivery: Secondary | ICD-10-CM

## 2020-05-22 LAB — NUSWAB BV AND CANDIDA, NAA
Candida albicans, NAA: NEGATIVE
Candida glabrata, NAA: NEGATIVE

## 2020-05-28 ENCOUNTER — Ambulatory Visit (INDEPENDENT_AMBULATORY_CARE_PROVIDER_SITE_OTHER): Payer: Managed Care, Other (non HMO) | Admitting: Obstetrics & Gynecology

## 2020-05-28 ENCOUNTER — Other Ambulatory Visit: Payer: Self-pay

## 2020-05-28 ENCOUNTER — Encounter: Payer: Managed Care, Other (non HMO) | Admitting: Obstetrics & Gynecology

## 2020-05-28 VITALS — BP 96/63 | HR 97 | Wt 119.6 lb

## 2020-05-28 DIAGNOSIS — Z3A36 36 weeks gestation of pregnancy: Secondary | ICD-10-CM

## 2020-05-28 DIAGNOSIS — O09529 Supervision of elderly multigravida, unspecified trimester: Secondary | ICD-10-CM

## 2020-05-28 DIAGNOSIS — O24410 Gestational diabetes mellitus in pregnancy, diet controlled: Secondary | ICD-10-CM

## 2020-05-28 DIAGNOSIS — O3429 Maternal care due to uterine scar from other previous surgery: Secondary | ICD-10-CM

## 2020-05-28 NOTE — Patient Instructions (Signed)
Return to office for any scheduled appointments. Call the office or go to the MAU at [facility] [place] if:  You begin to have strong, frequent contractions  Your water breaks.  Sometimes it is a big gush of fluid, sometimes it is just a trickle that keeps getting your panties wet or running down your legs  You have vaginal bleeding.  It is normal to have a small amount of spotting if your cervix was checked.   You do not feel your baby moving like normal.  If you do not, get something to eat and drink and lay down and focus on feeling your baby move.   If your baby is still not moving like normal, you should call the office or go to MAU.  Any other obstetric concerns.   

## 2020-05-28 NOTE — Progress Notes (Signed)
   PRENATAL VISIT NOTE  Subjective:  Wendy [name] is a 45 y.o. [id number] at [redacted]w[redacted]d being seen today for ongoing prenatal care.  She is currently monitored for the following issues for this high-risk pregnancy and has Encounter for supervision of high-risk pregnancy with elderly multigravida >69 years of age; Prior perinatal loss in second trimester, antepartum; Previous cesarean delivery, antepartum; Pregnancy with history of uterine myomectomy; GDM (gestational diabetes mellitus); Anemia affecting pregnancy; and Marginal insertion of umbilical cord affecting management of mother on their problem list.  Patient reports no complaints. Had some loose stools and cramping, none today.  Contractions: Irritability. Vag. Bleeding: None.  Movement: Present. Denies leaking of fluid.   The following portions of the patient's history were reviewed and updated as appropriate: allergies, current medications, past family history, past medical history, past social history, past surgical history and problem list.   Objective:   Vitals:   05/28/20 1308  BP: 96/63  Pulse: 97  Weight: 119 lb 9.6 oz (54.3 kg)    Fetal Status: Fetal Heart Rate (bpm): 136   Movement: Present  Presentation: Vertex  General:  Alert, oriented and cooperative. Patient is in no acute distress.  Skin: Skin is warm and dry. No rash noted.   Cardiovascular: Normal heart rate noted  Respiratory: Normal respiratory effort, no problems with respiration noted  Abdomen: Soft, gravid, appropriate for gestational age.  Pain/Pressure: Present     Pelvic: Cervical exam performed in the presence of a chaperone Dilation: Closed Effacement (%): 50 Station: Ballotable  Extremities: Normal range of motion.     Mental Status: Normal mood and affect. Normal behavior. Normal judgment and thought content.   Assessment and Plan:  Pregnancy: [id number] at [redacted]w[redacted]d 1. Diet controlled gestational diabetes mellitus (GDM) in third trimester Stable CBGs.  2.  Pregnancy with history of uterine myomectomy 3. [redacted] weeks gestation of pregnancy 4. Encounter for supervision of high-risk pregnancy with elderly multigravida >65 years of age Scheduled for RCS at 12 weeks. Preterm labor symptoms and general obstetric precautions including but not limited to vaginal bleeding, contractions, leaking of fluid and fetal movement were reviewed in detail with the patient. Please refer to After Visit Summary for other counseling recommendations.   Return in about 3 weeks (around 06/18/2020) for Postpartum cesarean incision check  6 weeks from now: Postpartum 2 hr GTT and visit.  Future Appointments  Date Time Provider [place]  05/29/2020  2:15 PM [facility] NURSE [facility]  05/29/2020  2:30 PM [facility] US3 [facility]  06/02/2020  9:00 AM MC-LD PAT 1 MC-INDC None  06/02/2020  9:45 AM MC-SCREENING MC-SDSC None  06/18/2020  1:15 PM [facility] NURSE [facility]    Verita Schneiders, MD

## 2020-05-29 ENCOUNTER — Encounter: Payer: Self-pay | Admitting: *Deleted

## 2020-05-29 ENCOUNTER — Ambulatory Visit: Payer: Managed Care, Other (non HMO) | Attending: Obstetrics

## 2020-05-29 ENCOUNTER — Other Ambulatory Visit: Payer: Self-pay

## 2020-05-29 ENCOUNTER — Ambulatory Visit: Payer: Managed Care, Other (non HMO) | Admitting: *Deleted

## 2020-05-29 DIAGNOSIS — O24410 Gestational diabetes mellitus in pregnancy, diet controlled: Secondary | ICD-10-CM | POA: Insufficient documentation

## 2020-05-29 DIAGNOSIS — O09529 Supervision of elderly multigravida, unspecified trimester: Secondary | ICD-10-CM

## 2020-06-02 ENCOUNTER — Other Ambulatory Visit (HOSPITAL_COMMUNITY): Payer: Managed Care, Other (non HMO)

## 2020-06-02 ENCOUNTER — Other Ambulatory Visit
Admission: RE | Admit: 2020-06-02 | Discharge: 2020-06-02 | Disposition: A | Discharge status: Home / Self Care | Payer: Managed Care, Other (non HMO) | Source: Ambulatory Visit | Attending: Obstetrics & Gynecology | Admitting: Obstetrics & Gynecology

## 2020-06-02 ENCOUNTER — Other Ambulatory Visit: Payer: Self-pay

## 2020-06-02 ENCOUNTER — Encounter (HOSPITAL_COMMUNITY)
Admission: RE | Admit: 2020-06-02 | Discharge: 2020-06-02 | Disposition: A | Discharge status: Home / Self Care | Payer: Managed Care, Other (non HMO) | Source: Ambulatory Visit | Attending: Obstetrics & Gynecology | Admitting: Obstetrics & Gynecology

## 2020-06-02 DIAGNOSIS — Z20822 Contact with and (suspected) exposure to covid-19: Secondary | ICD-10-CM | POA: Diagnosis not present

## 2020-06-02 DIAGNOSIS — Z01812 Encounter for preprocedural laboratory examination: Secondary | ICD-10-CM | POA: Insufficient documentation

## 2020-06-02 LAB — CBC
HCT: 29.4 % — ABNORMAL LOW (ref 36.0–46.0)
Hemoglobin: 9.7 g/dL — ABNORMAL LOW (ref 12.0–15.0)
MCH: 27.9 pg (ref 26.0–34.0)
MCHC: 33 g/dL (ref 30.0–36.0)
MCV: 84.5 fL (ref 80.0–100.0)
Platelets: 172 10*3/uL (ref 150–400)
RBC: 3.48 MIL/uL — ABNORMAL LOW (ref 3.87–5.11)
RDW: 14.4 % (ref 11.5–15.5)
WBC: 4.7 10*3/uL (ref 4.0–10.5)
nRBC: 0 % (ref 0.0–0.2)

## 2020-06-02 LAB — RPR: RPR Ser Ql: NONREACTIVE

## 2020-06-02 LAB — TYPE AND SCREEN
ABO/RH(D): A POS
Antibody Screen: NEGATIVE

## 2020-06-02 LAB — SARS CORONAVIRUS 2 (TAT 6-24 HRS): SARS Coronavirus 2: NEGATIVE

## 2020-06-04 ENCOUNTER — Inpatient Hospital Stay (HOSPITAL_COMMUNITY): Payer: Managed Care, Other (non HMO) | Admitting: Anesthesiology

## 2020-06-04 ENCOUNTER — Encounter (HOSPITAL_COMMUNITY)
Admission: RE | Disposition: A | Discharge status: Home / Self Care | Payer: Self-pay | Source: Home / Self Care | Attending: Obstetrics & Gynecology

## 2020-06-04 ENCOUNTER — Encounter (HOSPITAL_COMMUNITY): Payer: Self-pay | Admitting: Obstetrics & Gynecology

## 2020-06-04 ENCOUNTER — Other Ambulatory Visit: Payer: Self-pay

## 2020-06-04 ENCOUNTER — Inpatient Hospital Stay (HOSPITAL_COMMUNITY)
Admission: RE | Admit: 2020-06-04 | Discharge: 2020-06-07 | DRG: 787 | Disposition: A | Discharge status: Home / Self Care | Payer: Managed Care, Other (non HMO) | Attending: Obstetrics & Gynecology | Admitting: Obstetrics & Gynecology

## 2020-06-04 DIAGNOSIS — O43123 Velamentous insertion of umbilical cord, third trimester: Secondary | ICD-10-CM | POA: Diagnosis present

## 2020-06-04 DIAGNOSIS — O24419 Gestational diabetes mellitus in pregnancy, unspecified control: Secondary | ICD-10-CM | POA: Diagnosis present

## 2020-06-04 DIAGNOSIS — O24420 Gestational diabetes mellitus in childbirth, diet controlled: Secondary | ICD-10-CM | POA: Diagnosis present

## 2020-06-04 DIAGNOSIS — O34211 Maternal care for low transverse scar from previous cesarean delivery: Secondary | ICD-10-CM | POA: Diagnosis not present

## 2020-06-04 DIAGNOSIS — D649 Anemia, unspecified: Secondary | ICD-10-CM | POA: Diagnosis not present

## 2020-06-04 DIAGNOSIS — O34219 Maternal care for unspecified type scar from previous cesarean delivery: Secondary | ICD-10-CM | POA: Diagnosis present

## 2020-06-04 DIAGNOSIS — D62 Acute posthemorrhagic anemia: Secondary | ICD-10-CM | POA: Diagnosis not present

## 2020-06-04 DIAGNOSIS — Z3490 Encounter for supervision of normal pregnancy, unspecified, unspecified trimester: Secondary | ICD-10-CM

## 2020-06-04 DIAGNOSIS — O3429 Maternal care due to uterine scar from other previous surgery: Secondary | ICD-10-CM | POA: Diagnosis present

## 2020-06-04 DIAGNOSIS — O43199 Other malformation of placenta, unspecified trimester: Secondary | ICD-10-CM | POA: Diagnosis present

## 2020-06-04 DIAGNOSIS — O9081 Anemia of the puerperium: Secondary | ICD-10-CM | POA: Diagnosis not present

## 2020-06-04 DIAGNOSIS — Z3A37 37 weeks gestation of pregnancy: Secondary | ICD-10-CM | POA: Diagnosis not present

## 2020-06-04 DIAGNOSIS — Z8632 Personal history of gestational diabetes: Secondary | ICD-10-CM | POA: Diagnosis present

## 2020-06-04 DIAGNOSIS — O219 Vomiting of pregnancy, unspecified: Secondary | ICD-10-CM

## 2020-06-04 DIAGNOSIS — O99019 Anemia complicating pregnancy, unspecified trimester: Secondary | ICD-10-CM | POA: Diagnosis present

## 2020-06-04 DIAGNOSIS — O9902 Anemia complicating childbirth: Secondary | ICD-10-CM | POA: Diagnosis not present

## 2020-06-04 DIAGNOSIS — O09292 Supervision of pregnancy with other poor reproductive or obstetric history, second trimester: Secondary | ICD-10-CM

## 2020-06-04 LAB — GLUCOSE, CAPILLARY
Glucose-Capillary: 163 mg/dL — ABNORMAL HIGH (ref 70–99)
Glucose-Capillary: 69 mg/dL — ABNORMAL LOW (ref 70–99)
Glucose-Capillary: 96 mg/dL (ref 70–99)

## 2020-06-04 SURGERY — Surgical Case
Anesthesia: Spinal

## 2020-06-04 MED ORDER — ZOLPIDEM TARTRATE 5 MG PO TABS: 5.0000 mg | ORAL_TABLET | Freq: Every evening | ORAL | Status: DC | PRN

## 2020-06-04 MED ORDER — PHENYLEPHRINE HCL-NACL 20-0.9 MG/250ML-% IV SOLN
INTRAVENOUS | Status: DC | PRN
  Administered 2020-06-04 (×2): 60 ug/min via INTRAVENOUS

## 2020-06-04 MED ORDER — NALBUPHINE HCL 10 MG/ML IJ SOLN: 5.0000 mg | INTRAMUSCULAR | Status: DC | PRN

## 2020-06-04 MED ORDER — MEPERIDINE HCL 25 MG/ML IJ SOLN: 6.2500 mg | INTRAMUSCULAR | Status: DC | PRN

## 2020-06-04 MED ORDER — DIPHENHYDRAMINE HCL 25 MG PO CAPS: 25.0000 mg | ORAL_CAPSULE | Freq: Four times a day (QID) | ORAL | Status: DC | PRN

## 2020-06-04 MED ORDER — DEXTROSE 50 % IV SOLN
12.5000 g | INTRAVENOUS | Status: AC
  Administered 2020-06-04: 12.5 g via INTRAVENOUS

## 2020-06-04 MED ORDER — MENTHOL 3 MG MT LOZG: 1.0000 | LOZENGE | OROMUCOSAL | Status: DC | PRN

## 2020-06-04 MED ORDER — MIDAZOLAM HCL 2 MG/2ML IJ SOLN
INTRAMUSCULAR | Status: AC
  Filled 2020-06-04: qty 2

## 2020-06-04 MED ORDER — CEFAZOLIN SODIUM-DEXTROSE 2-4 GM/100ML-% IV SOLN
INTRAVENOUS | Status: AC
  Filled 2020-06-04: qty 100

## 2020-06-04 MED ORDER — KETOROLAC TROMETHAMINE 30 MG/ML IJ SOLN
INTRAMUSCULAR | Status: AC
  Filled 2020-06-04: qty 1

## 2020-06-04 MED ORDER — WITCH HAZEL-GLYCERIN EX PADS: 1.0000 "application " | MEDICATED_PAD | CUTANEOUS | Status: DC | PRN

## 2020-06-04 MED ORDER — NALBUPHINE HCL 10 MG/ML IJ SOLN: 5.0000 mg | Freq: Once | INTRAMUSCULAR | Status: DC | PRN

## 2020-06-04 MED ORDER — SUCCINYLCHOLINE CHLORIDE 20 MG/ML IJ SOLN
INTRAMUSCULAR | Status: DC | PRN
  Administered 2020-06-04: 120 mg via INTRAVENOUS

## 2020-06-04 MED ORDER — OXYCODONE HCL 5 MG/5ML PO SOLN
5.0000 mg | Freq: Once | ORAL | Status: DC | PRN
Start: 2020-06-04 — End: 2020-06-04

## 2020-06-04 MED ORDER — KETOROLAC TROMETHAMINE 30 MG/ML IJ SOLN
30.0000 mg | Freq: Four times a day (QID) | INTRAMUSCULAR | Status: AC
  Administered 2020-06-04 – 2020-06-05 (×3): 30 mg via INTRAVENOUS
  Filled 2020-06-04 (×3): qty 1

## 2020-06-04 MED ORDER — FENTANYL CITRATE (PF) 100 MCG/2ML IJ SOLN
INTRAMUSCULAR | Status: DC | PRN
  Administered 2020-06-04: 15 ug via INTRATHECAL

## 2020-06-04 MED ORDER — DEXMEDETOMIDINE (PRECEDEX) IN NS 20 MCG/5ML (4 MCG/ML) IV SYRINGE
PREFILLED_SYRINGE | INTRAVENOUS | Status: DC | PRN
  Administered 2020-06-04: 4 ug via INTRAVENOUS

## 2020-06-04 MED ORDER — CEFAZOLIN SODIUM-DEXTROSE 2-4 GM/100ML-% IV SOLN
2.0000 g | INTRAVENOUS | Status: AC
  Administered 2020-06-04: 2 g via INTRAVENOUS

## 2020-06-04 MED ORDER — OXYTOCIN-SODIUM CHLORIDE 30-0.9 UT/500ML-% IV SOLN
2.5000 [IU]/h | INTRAVENOUS | Status: AC
  Administered 2020-06-04: 2.5 [IU]/h via INTRAVENOUS
  Filled 2020-06-04: qty 500

## 2020-06-04 MED ORDER — ONDANSETRON HCL 4 MG/2ML IJ SOLN
INTRAMUSCULAR | Status: AC
  Filled 2020-06-04: qty 2

## 2020-06-04 MED ORDER — ACETAMINOPHEN 325 MG PO TABS: 650.0000 mg | ORAL_TABLET | ORAL | Status: DC | PRN

## 2020-06-04 MED ORDER — DEXAMETHASONE SODIUM PHOSPHATE 4 MG/ML IJ SOLN
INTRAMUSCULAR | Status: AC
  Filled 2020-06-04: qty 1

## 2020-06-04 MED ORDER — OXYTOCIN-SODIUM CHLORIDE 30-0.9 UT/500ML-% IV SOLN
INTRAVENOUS | Status: DC | PRN
  Administered 2020-06-04 (×2): 150 mL via INTRAVENOUS

## 2020-06-04 MED ORDER — FENTANYL CITRATE (PF) 100 MCG/2ML IJ SOLN
INTRAMUSCULAR | Status: DC | PRN
  Administered 2020-06-04: 85 ug via INTRAVENOUS

## 2020-06-04 MED ORDER — DEXAMETHASONE SODIUM PHOSPHATE 4 MG/ML IJ SOLN
INTRAMUSCULAR | Status: DC | PRN
  Administered 2020-06-04: 5 mg via INTRAVENOUS

## 2020-06-04 MED ORDER — SCOPOLAMINE 1 MG/3DAYS TD PT72
1.0000 | MEDICATED_PATCH | Freq: Once | TRANSDERMAL | Status: AC
  Administered 2020-06-04: 1.5 mg via TRANSDERMAL

## 2020-06-04 MED ORDER — PRENATAL MULTIVITAMIN CH
1.0000 | ORAL_TABLET | Freq: Every day | ORAL | Status: DC
  Administered 2020-06-05: 1 via ORAL
  Filled 2020-06-04: qty 1

## 2020-06-04 MED ORDER — HYDROMORPHONE HCL 1 MG/ML IJ SOLN
0.2500 mg | INTRAMUSCULAR | Status: DC | PRN
Start: 2020-06-04 — End: 2020-06-04
  Administered 2020-06-04: 0.5 mg via INTRAVENOUS

## 2020-06-04 MED ORDER — STERILE WATER FOR IRRIGATION IR SOLN
Status: DC | PRN
  Administered 2020-06-04: 1000 mL

## 2020-06-04 MED ORDER — LACTATED RINGERS IV SOLN: INTRAVENOUS | Status: DC | PRN

## 2020-06-04 MED ORDER — OXYCODONE HCL 5 MG PO TABS
5.0000 mg | ORAL_TABLET | ORAL | Status: DC | PRN
  Administered 2020-06-05 – 2020-06-07 (×7): 5 mg via ORAL
  Filled 2020-06-04 (×7): qty 1

## 2020-06-04 MED ORDER — BUPIVACAINE IN DEXTROSE 0.75-8.25 % IT SOLN
INTRATHECAL | Status: DC | PRN
  Administered 2020-06-04: 1.6 mL via INTRATHECAL

## 2020-06-04 MED ORDER — COCONUT OIL OIL: 1.0000 "application " | TOPICAL_OIL | Status: DC | PRN

## 2020-06-04 MED ORDER — EPHEDRINE SULFATE 50 MG/ML IJ SOLN
INTRAMUSCULAR | Status: DC | PRN
  Administered 2020-06-04: 10 mg via INTRAVENOUS

## 2020-06-04 MED ORDER — SODIUM CHLORIDE 0.9 % IR SOLN
Status: DC | PRN
  Administered 2020-06-04: 1

## 2020-06-04 MED ORDER — OXYCODONE HCL 5 MG PO TABS: 5.0000 mg | ORAL_TABLET | Freq: Once | ORAL | Status: DC | PRN

## 2020-06-04 MED ORDER — MIDAZOLAM HCL 2 MG/2ML IJ SOLN
INTRAMUSCULAR | Status: DC | PRN
  Administered 2020-06-04: 1 mg via INTRAVENOUS

## 2020-06-04 MED ORDER — DEXTROSE 50 % IV SOLN
INTRAVENOUS | Status: AC
  Filled 2020-06-04: qty 50

## 2020-06-04 MED ORDER — SCOPOLAMINE 1 MG/3DAYS TD PT72
MEDICATED_PATCH | TRANSDERMAL | Status: AC
  Filled 2020-06-04: qty 1

## 2020-06-04 MED ORDER — MORPHINE SULFATE (PF) 0.5 MG/ML IJ SOLN
INTRAMUSCULAR | Status: AC
  Filled 2020-06-04: qty 10

## 2020-06-04 MED ORDER — PROPOFOL 10 MG/ML IV BOLUS
INTRAVENOUS | Status: DC | PRN
  Administered 2020-06-04: 150 mg via INTRAVENOUS

## 2020-06-04 MED ORDER — DIPHENHYDRAMINE HCL 25 MG PO CAPS: 25.0000 mg | ORAL_CAPSULE | ORAL | Status: DC | PRN

## 2020-06-04 MED ORDER — PROMETHAZINE HCL 25 MG/ML IJ SOLN: 6.2500 mg | INTRAMUSCULAR | Status: DC | PRN

## 2020-06-04 MED ORDER — DIBUCAINE (PERIANAL) 1 % EX OINT: 1.0000 "application " | TOPICAL_OINTMENT | CUTANEOUS | Status: DC | PRN

## 2020-06-04 MED ORDER — POVIDONE-IODINE 10 % EX SWAB
2.0000 "application " | Freq: Once | CUTANEOUS | Status: AC
  Administered 2020-06-04: 2 via TOPICAL

## 2020-06-04 MED ORDER — ONDANSETRON HCL 4 MG/2ML IJ SOLN: 4.0000 mg | Freq: Three times a day (TID) | INTRAMUSCULAR | Status: DC | PRN

## 2020-06-04 MED ORDER — SODIUM CHLORIDE 0.9% FLUSH: 3.0000 mL | INTRAVENOUS | Status: DC | PRN

## 2020-06-04 MED ORDER — MORPHINE SULFATE (PF) 0.5 MG/ML IJ SOLN
INTRAMUSCULAR | Status: DC | PRN
  Administered 2020-06-04: 150 ug via INTRATHECAL

## 2020-06-04 MED ORDER — NALOXONE HCL 0.4 MG/ML IJ SOLN: 0.4000 mg | INTRAMUSCULAR | Status: DC | PRN

## 2020-06-04 MED ORDER — HYDROMORPHONE HCL 1 MG/ML IJ SOLN
INTRAMUSCULAR | Status: AC
  Filled 2020-06-04: qty 0.5

## 2020-06-04 MED ORDER — SIMETHICONE 80 MG PO CHEW
80.0000 mg | CHEWABLE_TABLET | ORAL | Status: DC | PRN
  Administered 2020-06-06: 80 mg via ORAL

## 2020-06-04 MED ORDER — LACTATED RINGERS IV SOLN: INTRAVENOUS | Status: DC

## 2020-06-04 MED ORDER — FENTANYL CITRATE (PF) 100 MCG/2ML IJ SOLN
INTRAMUSCULAR | Status: AC
  Filled 2020-06-04: qty 2

## 2020-06-04 MED ORDER — SIMETHICONE 80 MG PO CHEW
80.0000 mg | CHEWABLE_TABLET | Freq: Three times a day (TID) | ORAL | Status: DC
  Administered 2020-06-04 – 2020-06-07 (×6): 80 mg via ORAL
  Filled 2020-06-04 (×7): qty 1

## 2020-06-04 MED ORDER — ACETAMINOPHEN 10 MG/ML IV SOLN
INTRAVENOUS | Status: DC | PRN
  Administered 2020-06-04: 1000 mg via INTRAVENOUS

## 2020-06-04 MED ORDER — DIPHENHYDRAMINE HCL 50 MG/ML IJ SOLN: 12.5000 mg | INTRAMUSCULAR | Status: DC | PRN

## 2020-06-04 MED ORDER — NALOXONE HCL 4 MG/10ML IJ SOLN
1.0000 ug/kg/h | INTRAVENOUS | Status: DC | PRN
  Filled 2020-06-04: qty 5

## 2020-06-04 MED ORDER — ONDANSETRON HCL 4 MG/2ML IJ SOLN
INTRAMUSCULAR | Status: DC | PRN
  Administered 2020-06-04: 4 mg via INTRAVENOUS

## 2020-06-04 MED ORDER — SENNOSIDES-DOCUSATE SODIUM 8.6-50 MG PO TABS
2.0000 | ORAL_TABLET | Freq: Every day | ORAL | Status: DC
  Administered 2020-06-05 – 2020-06-07 (×3): 2 via ORAL
  Filled 2020-06-04 (×3): qty 2

## 2020-06-04 MED ORDER — TRANEXAMIC ACID-NACL 1000-0.7 MG/100ML-% IV SOLN
INTRAVENOUS | Status: DC | PRN
  Administered 2020-06-04: 1000 mg via INTRAVENOUS

## 2020-06-04 MED ORDER — KETOROLAC TROMETHAMINE 30 MG/ML IJ SOLN: 30.0000 mg | Freq: Once | INTRAMUSCULAR | Status: DC | PRN

## 2020-06-04 MED ORDER — IBUPROFEN 600 MG PO TABS
600.0000 mg | ORAL_TABLET | Freq: Four times a day (QID) | ORAL | Status: DC
  Administered 2020-06-05: 600 mg via ORAL
  Filled 2020-06-04: qty 1

## 2020-06-04 SURGICAL SUPPLY — 37 items
BARRIER ADHS 3X4 INTERCEED (GAUZE/BANDAGES/DRESSINGS) ×2 IMPLANT
BENZOIN TINCTURE PRP APPL 2/3 (GAUZE/BANDAGES/DRESSINGS) ×2 IMPLANT
CHLORAPREP W/TINT 26ML (MISCELLANEOUS) ×2 IMPLANT
CLAMP CORD UMBIL (MISCELLANEOUS) IMPLANT
CLOTH BEACON ORANGE TIMEOUT ST (SAFETY) ×2 IMPLANT
CLSR STERI-STRIP ANTIMIC 1/2X4 (GAUZE/BANDAGES/DRESSINGS) ×2 IMPLANT
DERMABOND ADVANCED (GAUZE/BANDAGES/DRESSINGS)
DERMABOND ADVANCED .7 DNX12 (GAUZE/BANDAGES/DRESSINGS) IMPLANT
DRSG OPSITE POSTOP 4X10 (GAUZE/BANDAGES/DRESSINGS) ×2 IMPLANT
ELECT REM PT RETURN 9FT ADLT (ELECTROSURGICAL) ×2
ELECTRODE REM PT RTRN 9FT ADLT (ELECTROSURGICAL) ×1 IMPLANT
EXTRACTOR VACUUM KIWI (MISCELLANEOUS) IMPLANT
GLOVE BIOGEL PI IND STRL 7.0 (GLOVE) ×3 IMPLANT
GLOVE BIOGEL PI INDICATOR 7.0 (GLOVE) ×3
GLOVE ECLIPSE 6.5 STRL STRAW (GLOVE) ×2 IMPLANT
GOWN STRL REUS W/TWL LRG LVL3 (GOWN DISPOSABLE) ×6 IMPLANT
KIT ABG SYR 3ML LUER SLIP (SYRINGE) IMPLANT
NEEDLE HYPO 25X5/8 SAFETYGLIDE (NEEDLE) IMPLANT
NS IRRIG 1000ML POUR BTL (IV SOLUTION) ×2 IMPLANT
PACK C SECTION WH (CUSTOM PROCEDURE TRAY) ×2 IMPLANT
PAD ABD 7.5X8 STRL (GAUZE/BANDAGES/DRESSINGS) ×2 IMPLANT
PAD OB MATERNITY 4.3X12.25 (PERSONAL CARE ITEMS) ×2 IMPLANT
PENCIL SMOKE EVAC W/HOLSTER (ELECTROSURGICAL) ×2 IMPLANT
RTRCTR C-SECT PINK 25CM LRG (MISCELLANEOUS) ×2 IMPLANT
STRIP CLOSURE SKIN 1/2X4 (GAUZE/BANDAGES/DRESSINGS) ×2 IMPLANT
SUT PLAIN 0 NONE (SUTURE) ×2 IMPLANT
SUT PLAIN 2 0 XLH (SUTURE) IMPLANT
SUT VIC AB 0 CT1 27 (SUTURE) ×2
SUT VIC AB 0 CT1 27XBRD ANBCTR (SUTURE) ×2 IMPLANT
SUT VIC AB 0 CTX 36 (SUTURE) ×3
SUT VIC AB 0 CTX36XBRD ANBCTRL (SUTURE) ×3 IMPLANT
SUT VIC AB 2-0 CT1 27 (SUTURE) ×1
SUT VIC AB 2-0 CT1 TAPERPNT 27 (SUTURE) ×1 IMPLANT
SUT VIC AB 4-0 KS 27 (SUTURE) ×2 IMPLANT
TOWEL OR 17X24 6PK STRL BLUE (TOWEL DISPOSABLE) ×2 IMPLANT
TRAY FOLEY W/BAG SLVR 14FR LF (SET/KITS/TRAYS/PACK) IMPLANT
WATER STERILE IRR 1000ML POUR (IV SOLUTION) ×2 IMPLANT

## 2020-06-04 NOTE — H&P (Addendum)
OBSTETRIC ADMISSION HISTORY AND PHYSICAL  Wendy [name] is a 45 y.o. female [phone] with IUP at 80w3dby 13 week ultrasound presenting for scheduled repeat Cesarean. She reports +FMs, No LOF, no VB, no blurry vision, headaches or peripheral edema, and RUQ pain.  She plans on breast feeding. She is undecided for birth control.  [facility]   Dating: By 13 week ultrasound --->  Estimated Date of Delivery: 06/22/20  Sono:  _0 , CWD, normal anatomy, cephalic presentation,  [id number] 32% EFW  Prenatal History/Complications:  -h/o Cesarean x1 -A1GDM -h/o myomectomy -h/o perinatal loss -AMA -A1GDM -marginal cord insertion  Past Medical History: Past Medical History:  Diagnosis Date   Anemia    Fibroid    Gestational diabetes     Past Surgical History: Past Surgical History:  Procedure Laterality Date   CESAREAN SECTION     EYE SURGERY Bilateral    MYOMECTOMY ABDOMINAL APPROACH      Obstetrical History: OB History     Gravida  4   Para  2   Term  1   Preterm  1   AB  1   Living  1      SAB  1   IAB      Ectopic      Multiple      Live Births  1           Social History Social History   Socioeconomic History   Marital status: Married    Spouse name: Not on file   Number of children: Not on file   Years of education: Not on file   Highest education level: Not on file  Occupational History   Not on file  Tobacco Use   Smoking status: Never Smoker   Smokeless tobacco: Never Used  Vaping Use   Vaping Use: Never used  Substance and Sexual Activity   Alcohol use: Never   Drug use: Never   Sexual activity: Not on file  Other Topics Concern   Not on file  Social History Narrative   Not on file   Social Determinants of Health   Financial Resource Strain: Not on file  Food Insecurity: Not on file  Transportation Needs: Not on file  Physical Activity: Not on file  Stress: Not on file  Social Connections:  Not on file    Family History: Family History  Problem Relation Age of Onset   Diabetes Mother    Hypertension Father     Allergies: No Known Allergies  Medications Prior to Admission  Medication Sig Dispense Refill Last Dose   ferrous sulfate (FERROUSUL) 325 (65 FE) MG tablet Take 1 tablet (325 mg total) by mouth every other day. 30 tablet 1 06/03/2020 at Unknown time   Prenatal Vit-Fe Fumarate-FA (PREPLUS) 27-1 MG TABS Take 1 tablet by mouth daily. 30 tablet 13 06/03/2020 at Unknown time   Accu-Chek Softclix Lancets lancets 1 each by Other route 4 (four) times daily. 100 each 12    Blood Glucose Monitoring Suppl (ACCU-CHEK GUIDE) w/Device KIT 1 Device by Does not apply route 4 (four) times daily. 1 kit 0    glucose blood (ACCU-CHEK GUIDE) test strip Use to check blood sugars four times a day was instructed 50 each 12    ondansetron (ZOFRAN ODT) 4 MG disintegrating tablet Take 1 tablet (4 mg total) by mouth every 6 (six) hours as needed for nausea. 20 tablet 0    promethazine (  PHENERGAN) 25 MG tablet Take 1 tablet (25 mg total) by mouth every 6 (six) hours as needed for nausea or vomiting. 30 tablet 2      Review of Systems   All systems reviewed and negative except as stated in HPI  Last menstrual period 09/28/2019. General appearance: alert, cooperative and appears stated age Lungs: normal WOB Heart: regular rate  Abdomen: soft, non-tender Extremities: no sign of DVT Fetal monitoring: wnl on Dopplers     Prenatal labs: ABO, Rh: --/--/A POS (03/22 0911) Antibody: NEG (03/22 0911) Rubella: 17.30 (10/05 1529) RPR: NON REACTIVE (03/22 0903)  HBsAg: Negative (10/05 1529)  HIV: Non Reactive (01/27 0859)  GBS: Negative/-- (03/08 1426)  2 hr Glucola 77/153/181 Genetic screening wnl Anatomy US wnl  Prenatal Transfer Tool  Maternal Diabetes: Yes:  Diabetes Type:  Diet controlled Genetic Screening: Normal Maternal Ultrasounds/Referrals: Normal Fetal Ultrasounds or other  Referrals:  Referred to Materal Fetal Medicine  Maternal Substance Abuse:  No Significant Maternal Medications:  None Significant Maternal Lab Results: Group B Strep negative  No results found for this or any previous visit (from the past 24 hour(s)).  Patient Active Problem List   Diagnosis Date Noted   Marginal insertion of umbilical cord affecting management of mother 04/23/2020   GDM (gestational diabetes mellitus) 04/13/2020   Anemia affecting pregnancy 04/13/2020   Pregnancy with history of uterine myomectomy 01/08/2020   Encounter for supervision of high-risk pregnancy with elderly multigravida >17 years of age 47/07/2019   Prior perinatal loss in second trimester, antepartum 12/17/2019   Previous cesarean delivery, antepartum 12/17/2019    Assessment/Plan:  Wendy [name] is a 46 y.o. [id number] at 59w3dhere for scheduled repeat Cesarean in the setting of A1GDM.  #rLTCS:  The risks of cesarean section were discussed with the patient including but were not limited to: bleeding which may require transfusion or reoperation; infection which may require antibiotics; injury to bowel, bladder, ureters or other surrounding organs; injury to the fetus; need for additional procedures including hysterectomy in the event of a life-threatening hemorrhage; placental abnormalities wth subsequent pregnancies, incisional problems, thromboembolic phenomenon and other postoperative/anesthesia complications.  The patient concurred with the proposed plan, giving informed written consent for the procedure.  Patient has been NPO since yesterday; she will remain NPO for procedure. Anesthesia and OR aware.  Preoperative prophylactic antibiotics and SCDs ordered on call to the OR.  To OR when ready.  #Pain: Per anesthesia #FWB: FHT wnl on Dopplers #ID: GBS negative #MOF: breast #MOC: undecided #Circ: undecided #A1GDM: Plan for POD#1 FBG and repeat 2hr gtt at postpartum appt. #Anemia: Hgb 9.7 on admission.  Will plan for iron supplementation in postpartum period and will have low threshold to give TXA at time of delivery in OR.  GRanda Ngo MD OB Fellow, Faculty Practice 06/04/2020 8:40 AM  Attestation of Attending Supervision of Obstetric Fellow: Evaluation and management procedures were performed by the Obstetric Fellow under my supervision and collaboration.  I have reviewed the Obstetric Fellow's note and chart, and I agree with the management and plan. I have also made any necessary editorial changes.   JAnnalee Genta DO Attending [facility] 06/04/2020 9:00 AM

## 2020-06-04 NOTE — Progress Notes (Signed)
Risk and benefits of cesarean section were discussed with the patient including but not limited to infection, bleeding, damage to bowel , bladder and baby with the need for further surgery. Pt voiced understanding and desires to proceed. Inform consent obtained  Janyth Pupa, DO Attending [place], [facility] for [employer], [place]

## 2020-06-04 NOTE — Anesthesia Postprocedure Evaluation (Signed)
Anesthesia Post Note  Patient: Wendy [name]  Procedure(s) Performed: CESAREAN SECTION (N/A )     Patient location during evaluation: PACU Anesthesia Type: Spinal Level of consciousness: awake and alert Pain management: pain level controlled Vital Signs Assessment: post-procedure vital signs reviewed and stable Respiratory status: spontaneous breathing, nonlabored ventilation and respiratory function stable Cardiovascular status: blood pressure returned to baseline and stable Postop Assessment: no apparent nausea or vomiting Anesthetic complications: no   No complications documented.  Last Vitals:  Vitals:   06/04/20 1130 06/04/20 1144  BP: 105/69 102/65  Pulse: 81 76  Resp: 12 14  Temp: (!) 36.3 C 36.6 C  SpO2: 96% 97%    Last Pain:  Vitals:   06/04/20 1200  TempSrc:   PainSc: 0-No pain   Pain Goal:                Epidural/Spinal Function Cutaneous sensation: Able to Wiggle Toes (06/04/20 1200), Patient able to flex knees: Yes (06/04/20 1200), Patient able to lift hips off bed: No (06/04/20 1200), Back pain beyond tenderness at insertion site: No (06/04/20 1200), Progressively worsening motor and/or sensory loss: No (06/04/20 1200), Bowel and/or bladder incontinence post epidural: No (06/04/20 1200)  Lynda Rainwater

## 2020-06-04 NOTE — Discharge Instructions (Signed)

## 2020-06-04 NOTE — Anesthesia Procedure Notes (Addendum)
Procedure Name: Intubation Date/Time: 06/04/2020 9:40 AM Performed by: Bufford Spikes, CRNA Pre-anesthesia Checklist: Patient identified, Emergency Drugs available, Suction available and Patient being monitored Patient Re-evaluated:Patient Re-evaluated prior to induction Oxygen Delivery Method: Circle system utilized Preoxygenation: Pre-oxygenation with 100% oxygen Induction Type: IV induction, Rapid sequence and Cricoid Pressure applied Ventilation: Mask ventilation without difficulty Laryngoscope Size: Miller and 2 Grade View: Grade I Tube type: Oral Tube size: 7.0 mm Number of attempts: 1 Airway Equipment and Method: Stylet and Oral airway Placement Confirmation: ETT inserted through vocal cords under direct vision,  positive ETCO2 and breath sounds checked- equal and bilateral Secured at: 21 cm Tube secured with: Tape Dental Injury: Teeth and Oropharynx as per pre-operative assessment

## 2020-06-04 NOTE — Lactation Note (Signed)
This note was copied from a baby's chart. Lactation Consultation Note  Patient Name: Wendy [name] [id number] Date: 06/04/2020 Reason for consult: Initial assessment;NICU baby;Early term 37-38.6wks Age:45 years  Visited with mom of 81 hours old ETI NICU female, she's a P2 and experienced BF. LC assisted mom with hand expression but no colostrum was observed at this point. She reported moderate breast changes during this pregnancy and voiced that it took at least 5 days for her milk to come in with her first baby.  Set up a DEBP, mom started pumping during [facility] consultation. She understands that pumping this early on is mainly for breast stimulation and not to get volume. Reviewed pumping schedule, breastmilk storage guidelines, lactogenesis II and benefits of breastmilk for NICU babies.  Feeding plan:  1. Encouraged mom to pump every 3 hours; at least 8 pumping sessions in 24 hours. Will try not to go more than 6 hours without pumping 2. Breast massage and hand expression were also encouraged prior pumping  No support person in mom's room at the time of [facility] consultation. Mom reported all questions and concerns were answered, she's aware of [place] OP services and will call PRN.   Maternal Data Has patient been taught Hand Expression?: Yes Does the patient have breastfeeding experience prior to this delivery?: Yes How long did the patient breastfeed?: 12 months, but baby is now 69 years old.  Feeding Mother's Current Feeding Choice: Breast Milk  LATCH Score                    Lactation Tools Discussed/Used Tools: Pump;Flanges Flange Size: 27 Breast pump type: Double-Electric Breast Pump Pump Education: Setup, frequency, and cleaning;Milk Storage Reason for Pumping: ETI in NICU Pumping frequency: q 3 hours Pumped volume: 0 mL  Interventions Interventions: Breast feeding basics reviewed;Breast massage;Hand express;DEBP  Discharge Pump: DEBP;Personal (she has a Motif DEBP at  home) [facility] Program: Yes  Consult Status Consult Status: Follow-up Date: 06/05/20 Follow-up type: In-patient    Wendy [name] 06/04/2020, 4:32 PM

## 2020-06-04 NOTE — Transfer of Care (Signed)
Immediate Anesthesia Transfer of Care Note  Patient: Wendy [name]  Procedure(s) Performed: CESAREAN SECTION (N/A )  Patient Location: PACU  Anesthesia Type:General  Level of Consciousness: awake, alert  and oriented  Airway & Oxygen Therapy: Patient Spontanous Breathing and Patient connected to nasal cannula oxygen  Post-op Assessment: Report given to RN and Post -op Vital signs reviewed and stable  Post vital signs: Reviewed and stable  Last Vitals:  Vitals Value Taken Time  BP    Temp    Pulse 75 06/04/20 1034  Resp 14 06/04/20 1034  SpO2 100 % 06/04/20 1034  Vitals shown include unvalidated device data.  Last Pain:  Vitals:   06/04/20 0759  TempSrc: Oral         Complications: No complications documented.

## 2020-06-04 NOTE — Anesthesia Preprocedure Evaluation (Signed)
Anesthesia Evaluation  Patient identified by MRN, date of birth, ID band Patient awake    Reviewed: Allergy & Precautions, NPO status , Patient's Chart, lab work & pertinent test results  Airway Mallampati: II  TM Distance: >3 FB Neck ROM: Full    Dental no notable dental hx.    Pulmonary neg pulmonary ROS,    Pulmonary exam normal breath sounds clear to auscultation       Cardiovascular negative cardio ROS Normal cardiovascular exam Rhythm:Regular Rate:Normal     Neuro/Psych negative neurological ROS  negative psych ROS   GI/Hepatic negative GI ROS, Neg liver ROS,   Endo/Other  negative endocrine ROSdiabetes, Gestational  Renal/GU negative Renal ROS  negative genitourinary   Musculoskeletal negative musculoskeletal ROS (+)   Abdominal   Peds negative pediatric ROS (+)  Hematology negative hematology ROS (+)   Anesthesia Other Findings   Reproductive/Obstetrics (+) Pregnancy                             Anesthesia Physical Anesthesia Plan  ASA: II  Anesthesia Plan: Spinal   Post-op Pain Management:    Induction:   PONV Risk Score and Plan: 2 and Ondansetron, Midazolam and Treatment may vary due to age or medical condition  Airway Management Planned: Natural Airway  Additional Equipment:   Intra-op Plan:   Post-operative Plan:   Informed Consent: I have reviewed the patients History and Physical, chart, labs and discussed the procedure including the risks, benefits and alternatives for the proposed anesthesia with the patient or authorized representative who has indicated his/her understanding and acceptance.     Dental advisory given  Plan Discussed with: CRNA  Anesthesia Plan Comments:         Anesthesia Quick Evaluation

## 2020-06-04 NOTE — Anesthesia Procedure Notes (Signed)
Spinal  Patient location during procedure: OB End time: 06/04/2020 9:11 AM Reason for block: surgical anesthesia Staffing Performed: other anesthesia staff  Anesthesiologist: Lynda Rainwater, MD Preanesthetic Checklist Completed: patient identified, IV checked, risks and benefits discussed, surgical consent, monitors and equipment checked, pre-op evaluation and timeout performed Spinal Block Patient position: sitting Prep: DuraPrep and site prepped and draped Patient monitoring: heart rate, cardiac monitor, continuous pulse ox and blood pressure Approach: midline Location: L3-4 Injection technique: single-shot Needle Needle type: Pencan  Needle gauge: 24 G Needle length: 10 cm Assessment Sensory level: T4 Events: CSF return Additional Notes SAB placed by SRNA under direct supervision

## 2020-06-04 NOTE — Op Note (Addendum)
Wendy [name] PROCEDURE DATE: 06/04/2020  PREOPERATIVE DIAGNOSES: Intrauterine pregnancy at [redacted]w[redacted]d weeks gestation;  H/o myomectomy, H/o Cesarean x1  POSTOPERATIVE DIAGNOSES: The same  PROCEDURE: Repeat Low Transverse Cesarean Section  SURGEON:  Dr. Janyth Pupa, DO  ASSISTANT:  Dr. Guillermina City, MD  ANESTHESIOLOGY TEAM: Anesthesiologist: Lynda Rainwater, MD CRNA: Bufford Spikes, CRNA Student Nurse Anesthetist: Edmund Hilda, RN  INDICATIONS: Wendy [name] is a 45 y.o. [id number] at [redacted]w[redacted]d here for cesarean section secondary to the indications listed under preoperative diagnoses; please see preoperative note for further details.  The risks of surgery were discussed with the patient including but were not limited to: bleeding which may require transfusion or reoperation; infection which may require antibiotics; injury to bowel, bladder, ureters or other surrounding organs; injury to the fetus; need for additional procedures including hysterectomy in the event of a life-threatening hemorrhage; formation of adhesions; placental abnormalities wth subsequent pregnancies; incisional problems; thromboembolic phenomenon and other postoperative/anesthesia complications.  The patient concurred with the proposed plan, giving informed written consent for the procedure.    FINDINGS:  Viable female infant in cephalic presentation.  Apgars 2, 7 and 9.  Clear amniotic fluid.  Intact placenta, three vessel cord.  Normal uterus, fallopian tubes and ovaries bilaterally.  ANESTHESIA: Spinal initially; transitioned to general anesthesia given inadequate block INTRAVENOUS FLUIDS: 2,000 ml   ESTIMATED BLOOD LOSS: 684 ml URINE OUTPUT: 250 ml SPECIMENS: Placenta sent to L&D COMPLICATIONS: None immediate  PROCEDURE IN DETAIL:  The patient preoperatively received intravenous antibiotics and had sequential compression devices applied to her lower extremities.  She was then taken to the operating room where spinal  anesthesia was administered and was found to be adequate. She was then placed in a dorsal supine position with a leftward tilt, and prepped and draped in a sterile manner.  A foley catheter was placed into her bladder and attached to constant gravity.  After an adequate timeout was performed, a Pfannenstiel skin incision was made with scalpel along her preexisting scar and carried through to the underlying layer of fascia with use of bovie. The fascia was incised in the midline, and this incision was extended bilaterally using the Mayo scissors.  Kocher clamps were applied to the superior aspect of the fascial incision and the underlying rectus muscles were dissected off bluntly and sharply.  A similar process was carried out on the inferior aspect of the fascial incision. The rectus muscles were separated in the midline and the peritoneum was entered bluntly. The Alexis self-retaining retractor was introduced into the abdominal cavity.  Attention was turned to the lower uterine segment where a low transverse hysterotomy was made with a scalpel and extended bilaterally bluntly.  The infant was successfully delivered, the cord was clamped and cut after 30 seconds given initial Apgars, and the infant was handed over to the awaiting neonatology team. Uterine massage was then administered, and the placenta delivered intact with a three-vessel cord. TXA was administered. The uterus was then cleared of clots and debris.  The hysterotomy was closed with 0 Vicryl in a running locked fashion, and an imbricating layer was also placed with 0 Vicryl. The pelvis was irrigated and cleared of all clot and debris. Hemostasis was confirmed on all surfaces. Interceed was placed along the hysterotomy site to assist with adhesions. The retractor was removed.  The peritoneum was closed with a 2-0 Vicryl running stitch. The fascia was then closed using 0 Vicryl in a running fashion. The skin was closed with a  4-0 Vicryl subcuticular  stitch. The patient tolerated the procedure well. Sponge, instrument and needle counts were correct x 3.  She was taken to the recovery room in stable condition.   Randa Ngo, MD OB Fellow, [facility] 06/04/2020 10:23 AM  Attestation of Attending Supervision of Obstetric Fellow: Evaluation and management procedures were performed by the Obstetric Fellow under my supervision and collaboration.  I have reviewed the Obstetric Fellow's note and chart, and I agree with the management and plan. I have also made any necessary editorial changes.   Annalee Genta, DO Attending [place], [facility] for [facility], [place] [facility] 06/04/2020 1:53 PM

## 2020-06-04 NOTE — Discharge Summary (Shared)
Postpartum Discharge Summary  Patient Name: Wendy [name] DOB: 02-01-1976 MRN: [id number]  Date of admission: 06/04/2020 Delivery date:06/04/2020  Delivering provider: Janyth [name]  Date of discharge: 06/07/2020  Admitting diagnosis: Pregnancy with history of uterine myomectomy [O34.29] Intrauterine normal pregnancy [Z34.90] Intrauterine pregnancy: [redacted]w[redacted]d    Secondary diagnosis:  Principal Problem:   Cesarean delivery delivered Active Problems:   Prior perinatal loss in second trimester, antepartum   Previous cesarean delivery, antepartum   Pregnancy with history of uterine myomectomy   GDM (gestational diabetes mellitus)   Anemia affecting pregnancy   Marginal insertion of umbilical cord affecting management of mother   Intrauterine normal pregnancy  Additional problems: as noted above Discharge diagnosis: Cesarean delivery delivered                                              Post partum procedures:None Augmentation: N/A Complications: None  Hospital course: Sceduled C/S   45y.o. yo [id number] admitted to the hospital 06/04/2020 for scheduled cesarean section with the following indication:h/o myomectomy, h/o Cesarean x1.Delivery details are as follows:  Membrane Rupture Time/Date: 9:43 AM ,06/04/2020   Delivery Method:C-Section, Low Transverse  Details of operation can be found in separate operative note.  Patient had an uncomplicated postpartum course.  She is ambulating, tolerating a regular diet, passing flatus, and urinating well. Patient is discharged home in stable condition on  06/07/20        Newborn Data: Birth date:06/04/2020  Birth time:9:44 AM  Gender:Female  Living status:Living  Apgars:2 ,7  Weight:2800 g     Magnesium Sulfate received: No BMZ received: No Rhophylac:N/A MMR:N/A T-DaP:Given prenatally Flu: Yes Transfusion:No  Physical exam  Vitals:   06/06/20 0520 06/06/20 1451 06/06/20 2026 06/07/20 0514  BP: 104/70 106/77 107/68 105/74   Pulse: 70 84 76 75  Resp: _0 Temp: 97.9 F (36.6 C) 98.6 F (37 C) 98 F (36.7 C) 98.1 F (36.7 C)  TempSrc: Oral Oral Oral Oral  SpO2: 100% 100% 100% 98%  Weight:      Height:       General: alert, cooperative and no distress Lochia: appropriate Uterine Fundus: firm Incision: Healing well with no significant drainage, No significant erythema, Dressing is clean, dry, and intact DVT Evaluation: No evidence of DVT seen on physical exam. Labs: Lab Results  Component Value Date   WBC 7.6 06/05/2020   HGB 8.1 (L) 06/05/2020   HCT 25.5 (L) 06/05/2020   MCV 86.1 06/05/2020   PLT 134 (L) 06/05/2020   CMP Latest Ref Rng & Units 02/05/2020  Glucose 70 - 99 mg/dL 98  BUN 6 - 20 mg/dL 8  Creatinine 0.44 - 1.00 mg/dL 0.31(L)  Sodium 135 - 145 mmol/L 136  Potassium 3.5 - 5.1 mmol/L 3.7  Chloride 98 - 111 mmol/L 102  CO2 22 - 32 mmol/L 25  Calcium 8.9 - 10.3 mg/dL 9.1  Total Protein 6.5 - 8.1 g/dL 6.8  Total Bilirubin 0.3 - 1.2 mg/dL 0.6  Alkaline Phos 38 - 126 U/L 61  AST 15 - 41 U/L 26  ALT 0 - 44 U/L 18   Edinburgh Score: Edinburgh Postnatal Depression Scale Screening Tool 06/04/2020  I have been able to laugh and see the funny side of things. 0  I have looked forward with enjoyment to things. 0  I have blamed myself unnecessarily when things went wrong. 0  I have been anxious or worried for no good reason. 0  I have felt scared or panicky for no good reason. 0  Things have been getting on top of me. 0  I have been so unhappy that I have had difficulty sleeping. 0  I have felt sad or miserable. 0  I have been so unhappy that I have been crying. 0  The thought of harming myself has occurred to me. 0  Edinburgh Postnatal Depression Scale Total 0     After visit meds:  Allergies as of 06/07/2020   No Known Allergies     Medication List    TAKE these medications   Accu-Chek Guide test strip Generic drug: glucose blood Use to check blood sugars four times a  day was instructed   Accu-Chek Guide w/Device Kit 1 Device by Does not apply route 4 (four) times daily.   Accu-Chek Softclix Lancets lancets 1 each by Other route 4 (four) times daily.   ferrous sulfate 325 (65 FE) MG tablet Commonly known as: FerrouSul Take 1 tablet (325 mg total) by mouth every other day.   ondansetron 4 MG disintegrating tablet Commonly known as: Zofran ODT Take 1 tablet (4 mg total) by mouth every 6 (six) hours as needed for nausea.   oxyCODONE 5 MG immediate release tablet Commonly known as: Oxy IR/ROXICODONE Take 1-2 tablets (5-10 mg total) by mouth every 4 (four) hours as needed for moderate pain.   PrePLUS 27-1 MG Tabs Take 1 tablet by mouth daily.   promethazine 25 MG tablet Commonly known as: PHENERGAN Take 1 tablet (25 mg total) by mouth every 6 (six) hours as needed for nausea or vomiting.        Discharge home in stable condition Infant Feeding: Breast Infant Disposition:home with mother Discharge instruction: per After Visit Summary and Postpartum booklet. Activity: Advance as tolerated. Pelvic rest for 6 weeks.  Diet: routine diet Future Appointments: Future Appointments  Date Time Provider [place]  06/18/2020  1:15 PM [facility] NURSE [facility]  07/09/2020  9:30 AM Donnamae Jude, MD [facility]   Follow up Visit: Message sent to [facility] by Dr. Astrid Drafts  Please schedule this patient for a In person postpartum visit in 6 weeks with the following provider: Any provider. Additional Postpartum F/U:2 hour GTT and Incision check 1 week  High risk pregnancy complicated by: now h/o Cesarean x2, h/o myomectomy, A1GDM Delivery mode:  C-Section, Low Transverse  Anticipated Birth Control:  IUD   06/07/2020 Wendy Shave, MD  Midwife attestation I have seen and examined this patient and agree with above documentation in the resident's note.   Wendy [name] is a 45 y.o. [id number] s/p RLTCS.   Pain is well  controlled.  Plan for birth control is IUD.  Method of Feeding: breast  PE:  BP 105/74 (BP Location: Right Arm)   Pulse 75   Temp 98.1 F (36.7 C) (Oral)   Resp 18   Ht 5' 4" (1.626 m)   Wt 54.2 kg   LMP 09/28/2019 (Approximate)   SpO2 98%   BMI 20.53 kg/m  Gen: well appearing Heart: reg rate Lungs: normal WOB Fundus firm Ext: soft, no pain, no edema  Recent Labs    06/05/20 0605  HGB 8.1*  HCT 25.5*     Plan: discharge today - postpartum care discussed - f/u clinic in 2 weeks for incision check then 4-6 6  weeks for postpartum visit and 2 hour GTT  Fatima Blank, CNM 9:59 AM

## 2020-06-05 LAB — CBC
HCT: 25.5 % — ABNORMAL LOW (ref 36.0–46.0)
Hemoglobin: 8.1 g/dL — ABNORMAL LOW (ref 12.0–15.0)
MCH: 27.4 pg (ref 26.0–34.0)
MCHC: 31.8 g/dL (ref 30.0–36.0)
MCV: 86.1 fL (ref 80.0–100.0)
Platelets: 134 10*3/uL — ABNORMAL LOW (ref 150–400)
RBC: 2.96 MIL/uL — ABNORMAL LOW (ref 3.87–5.11)
RDW: 14.3 % (ref 11.5–15.5)
WBC: 7.6 10*3/uL (ref 4.0–10.5)
nRBC: 0 % (ref 0.0–0.2)

## 2020-06-05 LAB — GLUCOSE, CAPILLARY: Glucose-Capillary: 95 mg/dL (ref 70–99)

## 2020-06-05 MED ORDER — ACETAMINOPHEN 160 MG/5ML PO SOLN
650.0000 mg | ORAL | Status: DC | PRN
  Administered 2020-06-05: 650 mg via ORAL
  Filled 2020-06-05 (×2): qty 20.3

## 2020-06-05 MED ORDER — ASCORBIC ACID 500 MG PO TABS
250.0000 mg | ORAL_TABLET | ORAL | Status: DC
  Administered 2020-06-05 – 2020-06-07 (×2): 250 mg via ORAL
  Filled 2020-06-05 (×2): qty 1

## 2020-06-05 MED ORDER — FERROUS SULFATE 325 (65 FE) MG PO TABS
325.0000 mg | ORAL_TABLET | ORAL | Status: DC
  Administered 2020-06-05 – 2020-06-07 (×2): 325 mg via ORAL
  Filled 2020-06-05 (×2): qty 1

## 2020-06-05 MED ORDER — COMPLETENATE 29-1 MG PO CHEW
1.0000 | CHEWABLE_TABLET | Freq: Every day | ORAL | Status: DC
  Administered 2020-06-06 – 2020-06-07 (×2): 1 via ORAL
  Filled 2020-06-05 (×2): qty 1

## 2020-06-05 MED ORDER — IBUPROFEN 100 MG/5ML PO SUSP
600.0000 mg | Freq: Four times a day (QID) | ORAL | Status: DC
  Administered 2020-06-05 – 2020-06-07 (×7): 600 mg via ORAL
  Filled 2020-06-05 (×8): qty 30

## 2020-06-05 NOTE — Progress Notes (Addendum)
POSTPARTUM PROGRESS NOTE  POD #1  Subjective:  Wendy [name] is a 45 y.o. [id number] with hx of A1GDM s/p rLTCS at [redacted]w[redacted]d. No acute events overnight. She reports she is doing well. She denies any problems with ambulating, or po intake. Her catheter was removed this AM and she has not yet voided. Denies nausea or vomiting. She has not passed flatus. Pain is well controlled.  Lochia is appropriate.  Objective: Blood pressure 93/64, pulse 64, temperature 97.9 F (36.6 C), temperature source Oral, resp. rate 17, height 5\' 4"  (1.626 m), weight 54.2 kg, last menstrual period 09/28/2019, SpO2 98 %.  Physical Exam:  General: alert, cooperative and no distress Chest: no respiratory distress Heart:regular rate, distal pulses intact Abdomen: soft, nontender,  Uterine Fundus: firm, appropriately tender DVT Evaluation: No calf swelling or tenderness Extremities: 1+ edema Skin: warm, dry; incision clean/dry/intact w/ honeycomb dressing in place  Recent Labs    06/02/20 0903  HGB 9.7*  HCT 29.4*    Assessment/Plan: Wendy [name] is a 45 y.o. [id number] with hx of A1GDM POD#1 rLTCS at [redacted]w[redacted]d.  POD#1 - Doing welll; pain well controlled.  Routine postpartum care  OOB, ambulated  Anemia: Hgb 9.7>8.1, Asymptomatic. Start po ferrous sulfate every other day. A1GDM: FBG this AM wnl. Contraception: IUD or nexplanon at postpartum visit Feeding: breast Dispo: Pt will be evaluated for discharge tomorrow.   LOS: 1 day   Wendy [name], MS3 06/05/2020, 7:56 AM   GME ATTESTATION:  I saw and evaluated the patient. I agree with the findings and the plan of care as documented in the medical student's note.  Wendy Senate, MD OB Fellow, [place] for [place] 06/05/2020 8:32 AM

## 2020-06-05 NOTE — Lactation Note (Signed)
This note was copied from a baby's chart. Lactation Consultation Note  Patient Name: Wendy [name] DGLOV'F Date: 06/05/2020 Reason for consult: Follow-up assessment;Early term 3-38.6wks Age:44 hours P2, ETI female infant with -4% weight loss. Infant had 3 voids and 2 stools. Mom has started supplementing infant with donor breast milk due infant being ETI and mom colostrum is not yet present with hand expression and took 5 days her milk to come in with her 1st child. LC entered the room, infant had green pacifier in mouth, was cuing to breastfeed.  Mom made multiple attempts for infant to sustain latch, LC advised mom to wait until infant is 107 weeks of age to offer pacifier. Per mom. Infant was latching well at breast before she offered the pacifier. Mom fitted with 5 french feeding tube, infant was supplement with donor breast milk at the breast, mom latched infant on her left breast using the football hold position, infant sustain latch and swallows were observed. Infant breastfeed for 15 minutes and appeared content afterwards. Mom will continue to Breastfeed infant according to cues, 8 to 12+or more times, STS. Mom knows to ask RN or LC for assistance with latch if needed.   Maternal Data    Feeding Mother's Current Feeding Choice: Breast Milk and Donor Milk  LATCH Score Latch: Grasps breast easily, tongue down, lips flanged, rhythmical sucking.  Audible Swallowing: Spontaneous and intermittent  Type of Nipple: Everted at rest and after stimulation  Comfort (Breast/Nipple): Soft / non-tender  Hold (Positioning): Assistance needed to correctly position infant at breast and maintain latch.  LATCH Score: 9   Lactation Tools Discussed/Used    Interventions Interventions: Breast feeding basics reviewed;Assisted with latch;Skin to skin;Breast massage;Hand express;Expressed milk;Position options;Support pillows;Adjust position;Breast compression;Education  Discharge     Consult Status Consult Status: Follow-up Date: 06/06/20 Follow-up type: In-patient    Vicente Serene 06/05/2020, 6:57 PM

## 2020-06-06 NOTE — Lactation Note (Signed)
This note was copied from a baby's chart. Lactation Consultation Note  Patient Name: Boy Catharine Kettlewell GCYOY'O Date: 06/06/2020 Reason for consult: Follow-up assessment;Early term 67-38.6wks Age:44 hours  Mom says she is offering the breast before providing DBM, but does not seem to think that infant is doing very well at the breast. Infant has only been receiving 10 mL of DBM via bottle.  I provided the "Formula feeding your baby" hand-out since it has volume parameters that are more acceptable for an infant at 50+ hrs of life. Mom verbalized understanding.  Mom felt that infant was doing well with bottle feeding. When I inquired which nipple was better (there were 3 different nipples in the room), she said the green, which is faster than the other 3. I asked if the swallows were loud and she said, yes. I explained we don't want loud swallows. I suggest she try the yellow slow-flow, but told her to switch to the purple extra-slow flow if the swallows were too loud.  Mom denies any discomfort with pumping.   Stan Head, RN was given an update.    Matthias Hughs [facility] 06/06/2020, 2:46 PM

## 2020-06-06 NOTE — Progress Notes (Signed)
Subjective: POD#2 rLTCS  Patient is doing well without complaints. Ambulating without difficulty. Voiding and passing flatus. Tolerating PO. Abdominal pain improved. Vaginal bleeding decreased.  Objective: Vital signs in last 24 hours: Temp:  [97.9 F (36.6 C)-98.6 F (37 C)] 97.9 F (36.6 C) (03/26 0520) Pulse Rate:  [70-72] 70 (03/26 0520) Resp:  [16-17] 17 (03/26 0520) BP: (94-107)/(63-70) 104/70 (03/26 0520) SpO2:  [99 %-100 %] 100 % (03/26 0520)  Physical Exam:  General: alert, cooperative and no distress Lochia: appropriate Uterine Fundus: firm Incision: dressing c/d/i DVT Evaluation: No evidence of DVT seen on physical exam.  Recent Labs    06/05/20 0605  HGB 8.1*  HCT 25.5*    Assessment/Plan: POD#2 rLTCS  -doing well, meeting pp milestones  -VSS  -declines contraception  -desires circ for baby, previously consented  #Acute blood loss anemia  -asymptomatic  -hgb 8.1 PO iron  #A1GDM  -FBGL 95  -2hr gtt at postpartum visit  Plan for discharge tomorrow.  Arrie Senate 1/91/6606, 9:32 AM

## 2020-06-07 MED ORDER — IBUPROFEN 600 MG PO TABS: 600.0000 mg | ORAL_TABLET | Freq: Four times a day (QID) | ORAL | 1 refills | Status: DC | PRN

## 2020-06-07 MED ORDER — OXYCODONE HCL 5 MG PO TABS: 5.0000 mg | ORAL_TABLET | ORAL | 0 refills | Status: DC | PRN

## 2020-06-07 NOTE — Lactation Note (Signed)
This note was copied from a baby's chart. Lactation Consultation Note  Patient Name: Wendy [name] KGOVP'C Date: 06/07/2020   Age:44 hours  LC Note:  Per RN, mother no longer desires a lactation consult.  RN will inform me if mother changes her mind.     Maternal Data    Feeding Nipple Type: Extra Slow Flow  LATCH Score                    Lactation Tools Discussed/Used    Interventions    Discharge    Consult Status      Dalis Beers R Tesla Bochicchio 06/07/2020, 12:08 PM

## 2020-06-09 ENCOUNTER — Encounter: Payer: Self-pay | Admitting: *Deleted

## 2020-06-18 ENCOUNTER — Other Ambulatory Visit: Payer: Self-pay

## 2020-06-18 ENCOUNTER — Ambulatory Visit (INDEPENDENT_AMBULATORY_CARE_PROVIDER_SITE_OTHER): Payer: Managed Care, Other (non HMO)

## 2020-06-18 NOTE — Progress Notes (Signed)
Subjective:     Wendy [name] is a 45 y.o. female who presents to the clinic 1 weeks status post Cesarean Section delivery  for pregnancy. Eating a regular diet without difficulty. Bowel movements are normal. The patient is not having any pain.  The following portions of the patient's history were reviewed and updated as appropriate: allergies, past family history, past medical history, past social history, past surgical history and problem list.    Objective:    BP 103/69   Pulse 75   LMP 09/28/2019 (Approximate)   Breastfeeding Unknown  General:  alert     Incision:   no drainage, no erythema, no hernia, no seroma, no swelling, no dehiscence, incision well approximated     Assessment:    Doing well postoperatively.   Plan:    1. Continue any current medications. 2. Honeycomb removed and wound care instructions given.  3. Follow up at postpartum visit.   Huey Bienenstock, CMA

## 2020-06-18 NOTE — Progress Notes (Signed)
Patient seen and assessed by nursing staff.  Agree with documentation and plan.  

## 2020-07-09 ENCOUNTER — Other Ambulatory Visit: Payer: Self-pay

## 2020-07-09 ENCOUNTER — Encounter: Payer: Self-pay | Admitting: Family Medicine

## 2020-07-09 ENCOUNTER — Ambulatory Visit (INDEPENDENT_AMBULATORY_CARE_PROVIDER_SITE_OTHER): Payer: Managed Care, Other (non HMO) | Admitting: Family Medicine

## 2020-07-09 VITALS — BP 93/60 | HR 72 | Wt 98.0 lb

## 2020-07-09 DIAGNOSIS — Z01812 Encounter for preprocedural laboratory examination: Secondary | ICD-10-CM | POA: Diagnosis not present

## 2020-07-09 DIAGNOSIS — Z30430 Encounter for insertion of intrauterine contraceptive device: Secondary | ICD-10-CM | POA: Diagnosis not present

## 2020-07-09 DIAGNOSIS — O24410 Gestational diabetes mellitus in pregnancy, diet controlled: Secondary | ICD-10-CM

## 2020-07-09 DIAGNOSIS — Z8632 Personal history of gestational diabetes: Secondary | ICD-10-CM | POA: Diagnosis not present

## 2020-07-09 LAB — POCT URINE PREGNANCY: Preg Test, Ur: NEGATIVE

## 2020-07-09 MED ORDER — LEVONORGESTREL 20.1 MCG/DAY IU IUD
1.0000 | INTRAUTERINE_SYSTEM | Freq: Once | INTRAUTERINE | Status: AC
  Administered 2020-07-09: 1 via INTRAUTERINE

## 2020-07-09 NOTE — Patient Instructions (Signed)
Intrauterine Device Insertion, Care After This sheet gives you information about how to care for yourself after your procedure. Your health care provider may also give you more specific instructions. If you have problems or questions, contact your health care provider. What can I expect after the procedure? After the procedure, it is common to have:  Cramps and pain in the abdomen.  Bleeding. It may be light or heavy. This may last for a few days.  Lower back pain.  Dizziness.  Headaches.  Nausea. Follow these instructions at home:  Before resuming sexual activity, check to make sure that you can feel the IUD string or strings. You should be able to feel the end of the string below the opening of your cervix. If your IUD string is in place, you may resume sexual activity. ? If you had a hormonal IUD inserted more than 7 days after your most recent period started, you will need to use a backup method of birth control for 7 days after IUD insertion. Ask your health care provider whether this applies to you.  Continue to check that the IUD is still in place by feeling for the strings after every menstrual period, or once a month.  An IUD will not protect you from sexually transmitted infections (STIs). Use methods to prevent the exchange of body fluids between partners (barrier protection) every time you have sex. Barrier protection can be used during oral, vaginal, or anal sex. Commonly used barrier methods include: ? Female condom. ? Female condom. ? Dental dam.  Take over-the-counter and prescription medicines only as told by your health care provider.  Keep all follow-up visits as told by your health care provider. This is important.   Contact a health care provider if:  You feel light-headed or weak.  You have any of the following problems with your IUD string or strings: ? The string bothers or hurts you or your sexual partner. ? You cannot feel the string. ? The string has  gotten longer.  You can feel the IUD in your vagina.  You think you may be pregnant, or you miss your menstrual period.  You think you may have a sexually transmitted infection (STI). Get help right away if:  You have flu-like symptoms, such as tiredness (fatigue) and muscle aches.  You have a fever and chills.  You have bleeding that is heavier or lasts longer than a normal menstrual cycle.  You have abnormal or bad-smelling discharge from your vagina.  You develop abdominal pain that is new, is getting worse, or is not in the same area of earlier cramping and pain.  You have pain during sexual activity. Summary  After the procedure, it is common to have cramps and pain in the abdomen. It is also common to have light bleeding or heavier bleeding that is like your menstrual period.  Continue to check that the IUD is still in place by feeling for the strings after every menstrual period, or once a month.  Keep all follow-up visits as told by your health care provider. This is important.  Contact your health care provider if you have problems with your IUD strings, such as the string getting longer or bothering you or your sexual partner. This information is not intended to replace advice given to you by your health care provider. Make sure you discuss any questions you have with your health care provider. Document Revised: 02/19/2019 Document Reviewed: 02/19/2019 Elsevier Patient Education  [place].

## 2020-07-09 NOTE — Progress Notes (Signed)
[place] Partum Visit Note  Wendy [name] is a 45 y.o. [phone] female who presents for a postpartum visit. She is 5 weeks postpartum following a repeat cesarean section.  I have fully reviewed the prenatal and intrapartum course. The delivery was at 37.3 gestational weeks.  Anesthesia: spinal. Postpartum course has been uncomplicated . Baby is doing well. Baby is feeding by breast. Bleeding light.. Bowel function is normal. Bladder function is normal. Patient is not sexually active. Contraception method is IUD. Postpartum depression screening: negative.   The pregnancy intention screening data noted above was reviewed. Potential methods of contraception were discussed. The patient elected to proceed with IUD or IUS.    Edinburgh Postnatal Depression Scale - 07/09/20 0916      Edinburgh Postnatal Depression Scale:  In the Past 7 Days   I have been able to laugh and see the funny side of things. 0    I have looked forward with enjoyment to things. 0    I have blamed myself unnecessarily when things went wrong. 0    I have been anxious or worried for no good reason. 0    I have felt scared or panicky for no good reason. 0    Things have been getting on top of me. 0    I have been so unhappy that I have had difficulty sleeping. 0    I have felt sad or miserable. 0    I have been so unhappy that I have been crying. 0    The thought of harming myself has occurred to me. 0    Edinburgh Postnatal Depression Scale Total 0           Health Maintenance Due  Topic Date Due  . URINE MICROALBUMIN  Never done  . COVID-19 Vaccine (3 - Booster for Pfizer series) 03/01/2020    The following portions of the patient's history were reviewed and updated as appropriate: allergies, current medications, past family history, past medical history, past social history, past surgical history and problem list.  Review of Systems Pertinent items noted in HPI and remainder of comprehensive ROS otherwise  negative.  Objective:  BP 93/60   Pulse 72   Wt 98 lb (44.5 kg)   LMP 09/28/2019 (Approximate)   BMI 16.82 kg/m    General:  alert, cooperative and appears stated age  Lungs: normal effort  Heart:  regular rate and rhythm  Abdomen: soft, non-tender; bowel sounds normal; no masses,  no organomegaly   Wound well approximated incision  GU exam:  atrophic vaginal mucosa, female circumcision, small cervix        Procedure: Patient identified, informed consent performed, signed copy in chart, time out was performed.  Urine pregnancy test negative.  Speculum placed in the vagina.  Cervix visualized.  Cleaned with Betadine x 3.  Grasped anteriourly with a single tooth tenaculum.  Uterus sounded to 7 cm with help of os finder.  Mirena IUD placed per manufacturer's recommendations.  Strings trimmed to 3 cm.   Patient given post procedure instructions and Mirena care card with expiration date.  Patient is asked to check IUD strings periodically and follow up in 4-6 weeks for IUD check.  Assessment:    1. Diet controlled gestational diabetes mellitus (GDM) in third trimester 2 hour OGT today   Normal postpartum exam.   Plan:   Essential components of care per ACOG recommendations:  1.  Mood and well being: Patient with negative depression screening today. Reviewed  local resources for support.  - Patient does not use tobacco, - hx of drug use? No    2. Infant care and feeding:  -Patient currently breastmilk feeding? Yes Discussed return to work and pumping. If needed, patient was provided letter for work to allow for every 2-3 hr pumping breaks, and to be granted a private location to express breastmilk and refrigerated area to store breastmilk. Reviewed importance of draining breast regularly to support lactation.  3. Sexuality, contraception and birth spacing - Patient does not want a pregnancy in the next year.  Desired family size is 2 children.  - Reviewed forms of contraception  in tiered fashion. Patient desired IUD today.   - Discussed birth spacing of 18 months  4. Sleep and fatigue -Encouraged family/partner/community support of 4 hrs of uninterrupted sleep to help with mood and fatigue  5. Physical Recovery  - Discussed patients delivery RLTCS and complications - Patient had a C-section. Perineal healing reviewed. Patient expressed understanding - Patient has urinary incontinence? No - Patient is safe to resume physical and sexual activity Use backup until IUD placement is confirmed  6.  Health Maintenance - HM due items addressed No - breastfeeding, but will need mammogram once done. - Last pap smear done 12/2019 and was normal with negative HPV. Pap smear not done at today's visit.  Mammogram due once BF is finished  Return in about 4 weeks (around 08/06/2020) for iud check.   Donnamae Jude, MD [facility] [employer], [place]

## 2020-07-10 LAB — GLUCOSE TOLERANCE, 2 HOURS
Glucose, 2 hour: 194 mg/dL — ABNORMAL HIGH (ref 65–139)
Glucose, GTT - Fasting: 80 mg/dL (ref 65–99)

## 2020-07-16 ENCOUNTER — Telehealth: Payer: Self-pay | Admitting: *Deleted

## 2020-07-16 DIAGNOSIS — Z8632 Personal history of gestational diabetes: Secondary | ICD-10-CM

## 2020-07-16 NOTE — Telephone Encounter (Signed)
-----   Message from Donnamae Jude, MD sent at 07/15/2020  4:22 PM EDT ----- Failed 2 hour--needs PCP referral please.

## 2020-07-16 NOTE — Telephone Encounter (Signed)
Pt informed of results and needing to follow up with PCP, will place a referral for pt.

## 2020-08-05 ENCOUNTER — Ambulatory Visit (INDEPENDENT_AMBULATORY_CARE_PROVIDER_SITE_OTHER): Payer: Managed Care, Other (non HMO) | Admitting: Advanced Practice Midwife

## 2020-08-05 ENCOUNTER — Other Ambulatory Visit: Payer: Self-pay

## 2020-08-05 ENCOUNTER — Encounter: Payer: Self-pay | Admitting: Advanced Practice Midwife

## 2020-08-05 VITALS — BP 97/66 | HR 71 | Wt 98.0 lb

## 2020-08-05 DIAGNOSIS — Z30431 Encounter for routine checking of intrauterine contraceptive device: Secondary | ICD-10-CM | POA: Diagnosis not present

## 2020-08-05 NOTE — Progress Notes (Signed)
    GYNECOLOGY OFFICE ENCOUNTER NOTE  History:  45 y.o. [id number] here today for today for IUD string check; Mirena  IUD was placed  07/09/2020. No complaints about the IUD, no concerning side effects.  The following portions of the patient's history were reviewed and updated as appropriate: allergies, current medications, past family history, past medical history, past social history, past surgical history and problem list. Last pap smear on 12/17/2019 was normal, negative HRHPV.  Review of Systems:  Pertinent items are noted in HPI.   Objective:  Physical Exam Blood pressure 97/66, pulse 71, weight 98 lb (44.5 kg), unknown if currently breastfeeding. CONSTITUTIONAL: Well-developed, well-nourished female in no acute distress.  NEUROLOGIC: Alert and oriented to person, place, and time. Normal reflexes, muscle tone coordination.  PSYCHIATRIC: Normal mood and affect. Normal behavior. Normal judgment and thought content. CARDIOVASCULAR: Normal heart rate noted RESPIRATORY: Effort and breath sounds normal, no problems with respiration noted ABDOMEN: Soft, no distention noted.   PELVIC: Normal appearing external genitalia; normal appearing vaginal mucosa and cervix.  IUD strings visualized, about 3 cm in length outside cervix. Done in the presence of a chaperone.   Assessment & Plan:  Patient to keep IUD in place for up to seven years; can come in for removal if she desires pregnancy earlier or for any concerning side effects.  Mallie Snooks, MSN, CNM Certified Nurse Midwife, [facility] [address] [facility] [employer], [place] 08/05/20 3:42 PM

## 2020-08-05 NOTE — Patient Instructions (Signed)

## 2020-08-06 ENCOUNTER — Other Ambulatory Visit: Payer: Self-pay | Admitting: Family Medicine

## 2020-08-21 ENCOUNTER — Encounter: Payer: Self-pay | Admitting: Radiology

## 2020-09-18 IMAGING — US US OB TRANSVAGINAL
1 series · 14 of 16 positions shown · non-contrast
Comparison: 08/06/2019

CLINICAL DATA: Vaginal bleeding and pelvic pain. First trimester
pregnancy with inconclusive fetal viability.

EXAM:
TRANSVAGINAL OB ULTRASOUND
TECHNIQUE: Transvaginal ultrasound was performed for complete evaluation of the
gestation as well as the maternal uterus, adnexal regions, and
pelvic cul-de-sac.

[Series 1: us ob comp less 14 wks · 16 acquisitions, 14 frames shown]
[im 1/16]
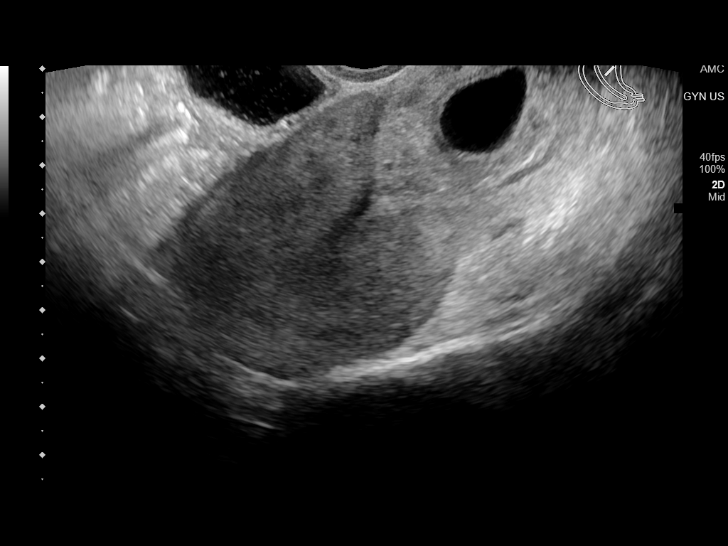
[im 2/16]
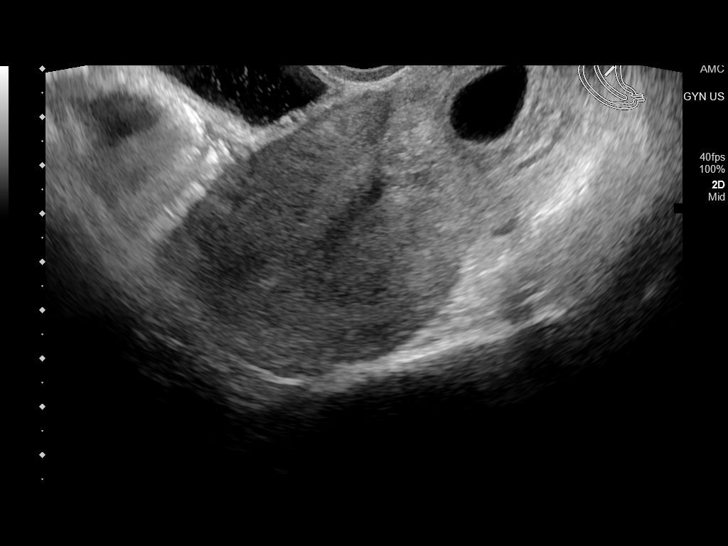
[im 3/16]
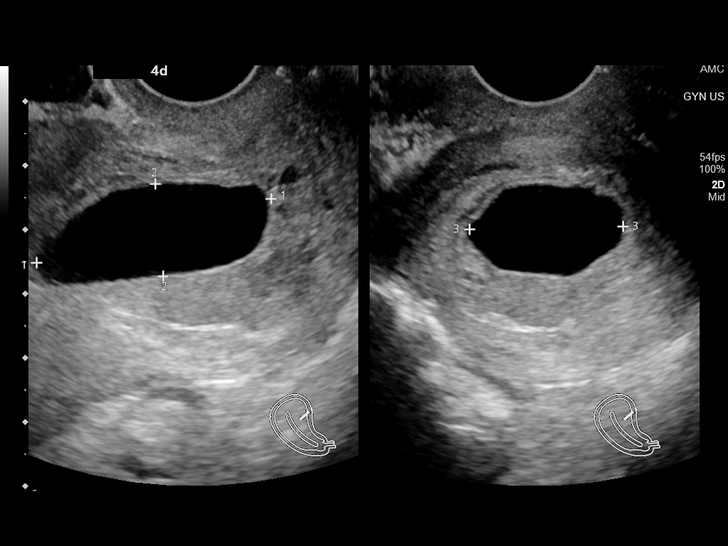
[im 5/16]
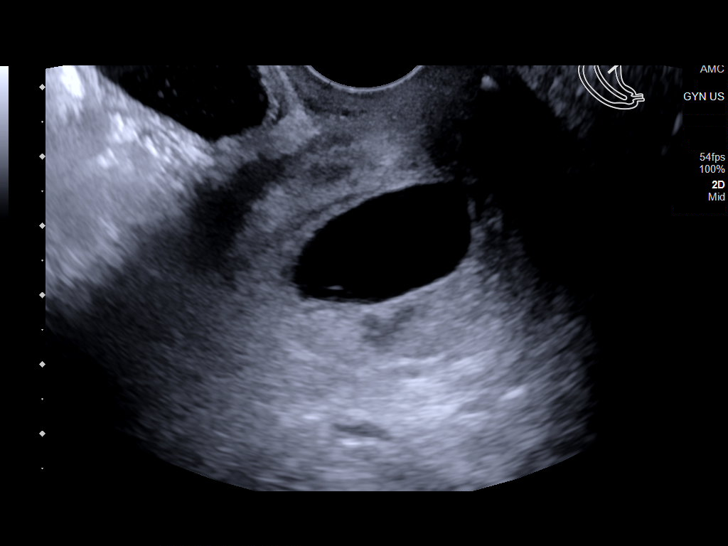
[im 6/16]
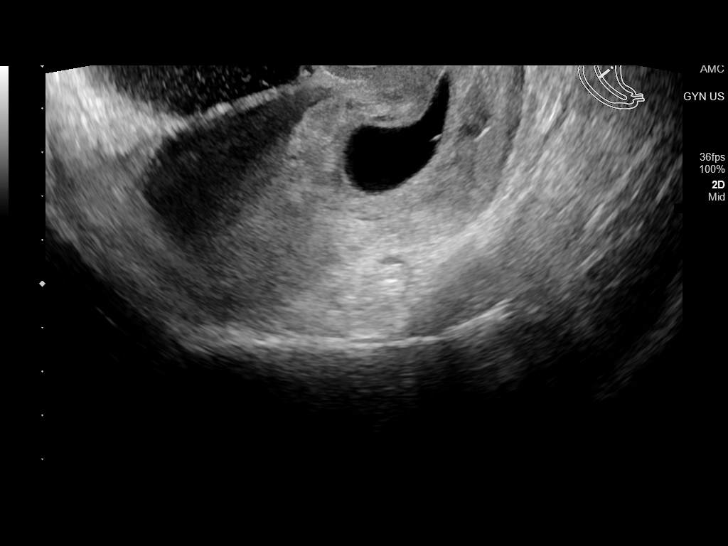
[im 7/16]
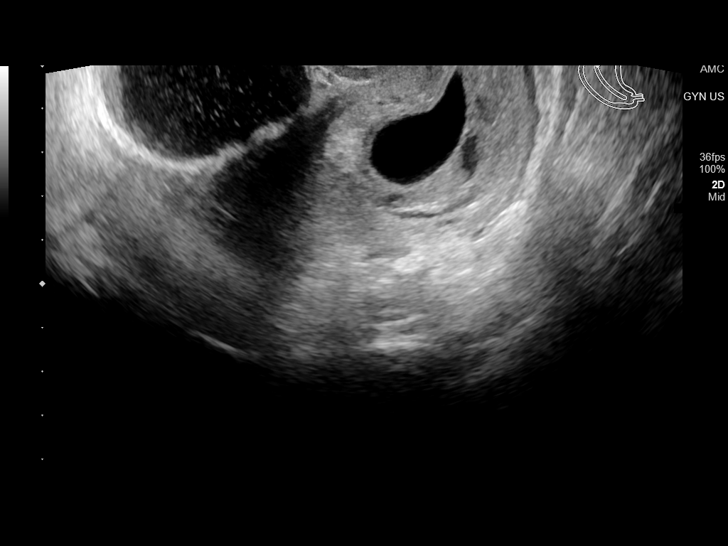
[im 8/16]
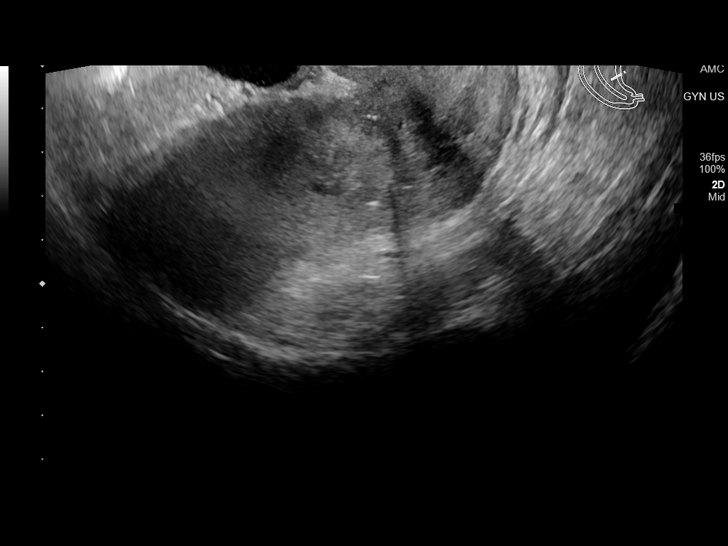
[im 9/16]
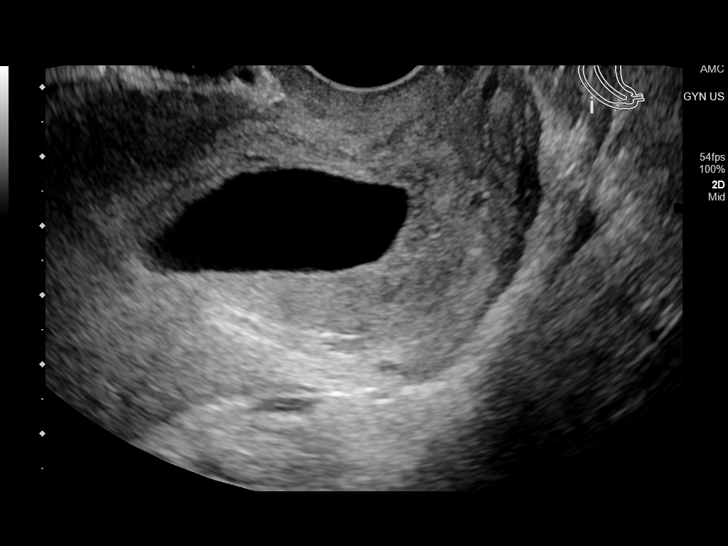
[im 10/16]
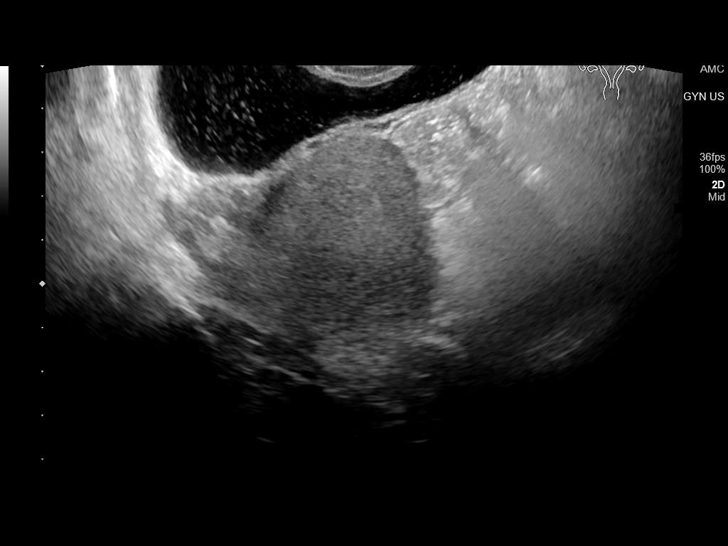
[im 11/16]
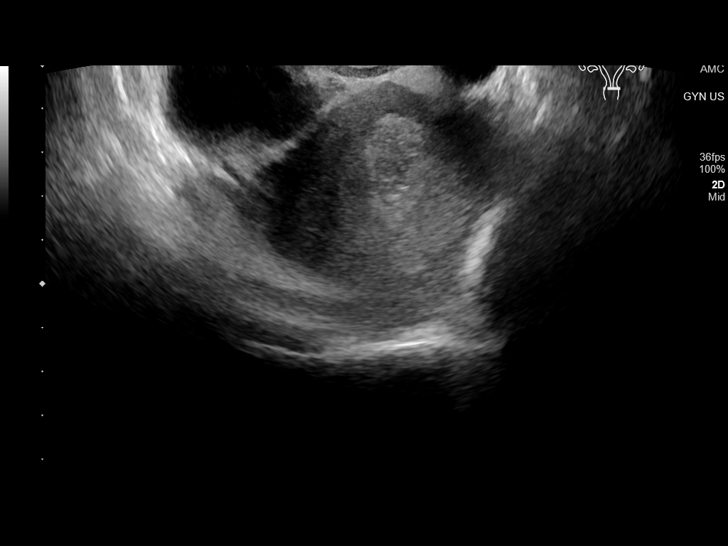
[im 13/16]
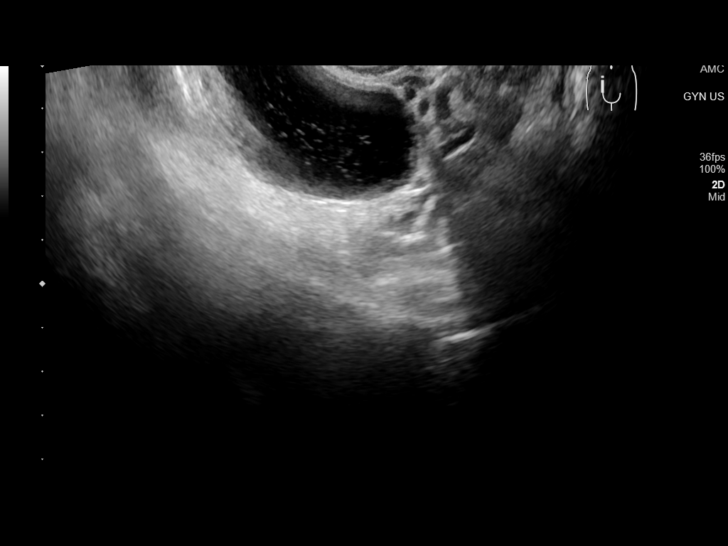
[im 14/16]
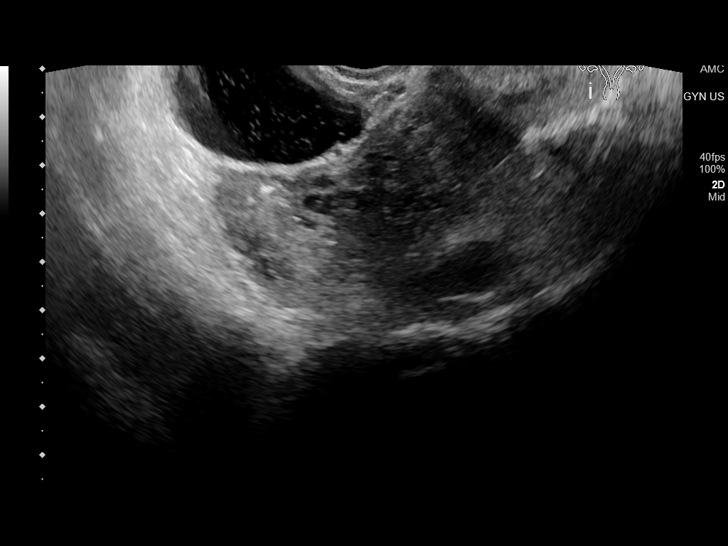
[im 15/16]
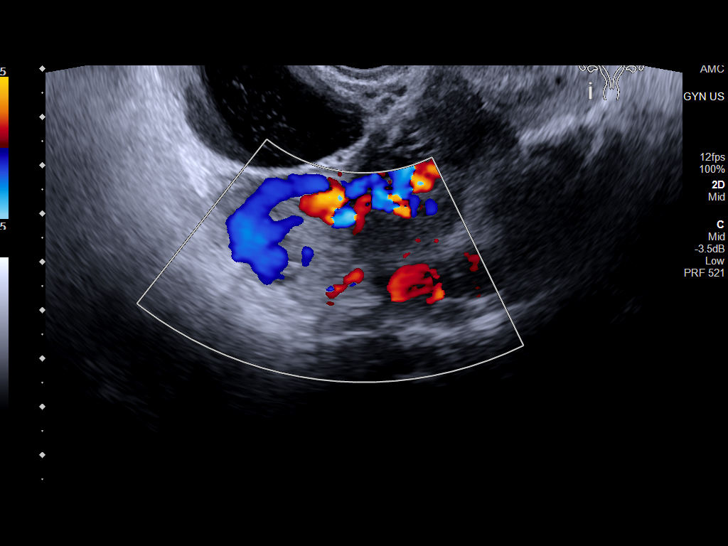
[im 16/16]
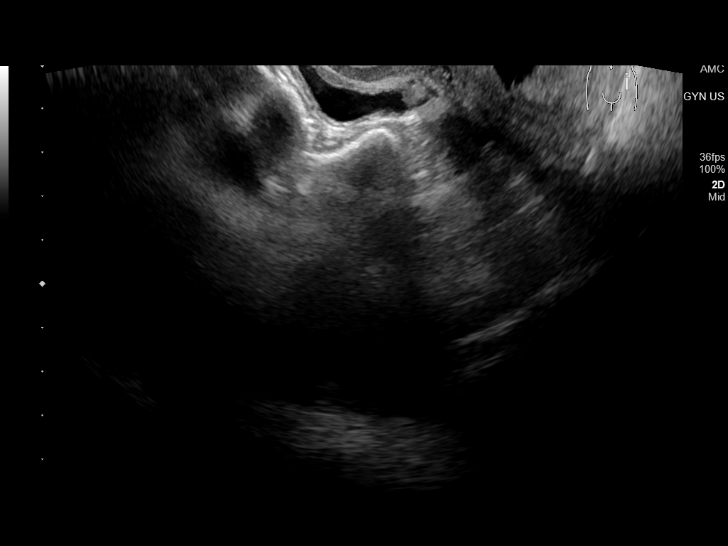

[14 of 16 positions shown; findings below may reference images not displayed]

FINDINGS: Intrauterine gestational sac: Irregular sac-like collection
persists, but has migrated inferiorly into the lower uterine segment
and endocervical canal.

Yolk sac:  Tiny yolk sac suspected

Embryo:  Not Visualized.

MSD: 25 mm   7 w   4 d

Subchorionic hemorrhage:  None visualized.

Maternal uterus/adnexae: Normal appearance of right ovary. Left
ovary is not directly visualized, however no adnexal mass or
abnormal free fluid identified.
IMPRESSION: Findings meet definitive criteria for failed pregnancy. This follows
SRU consensus guidelines: Diagnostic Criteria for Nonviable
Pregnancy Early in the First Trimester. N Engl J Med

## 2020-12-01 ENCOUNTER — Encounter: Payer: Self-pay | Admitting: Radiology

## 2022-02-17 DIAGNOSIS — Z0000 Encounter for general adult medical examination without abnormal findings: Secondary | ICD-10-CM | POA: Diagnosis not present

## 2022-02-17 DIAGNOSIS — Z130 Encounter for screening for diseases of the blood and blood-forming organs and certain disorders involving the immune mechanism: Secondary | ICD-10-CM | POA: Diagnosis not present

## 2022-02-17 DIAGNOSIS — Z1331 Encounter for screening for depression: Secondary | ICD-10-CM | POA: Diagnosis not present

## 2022-02-17 DIAGNOSIS — Z1211 Encounter for screening for malignant neoplasm of colon: Secondary | ICD-10-CM | POA: Diagnosis not present

## 2022-02-17 DIAGNOSIS — Z23 Encounter for immunization: Secondary | ICD-10-CM | POA: Diagnosis not present

## 2022-02-17 DIAGNOSIS — Z13220 Encounter for screening for lipoid disorders: Secondary | ICD-10-CM | POA: Diagnosis not present

## 2022-02-17 DIAGNOSIS — Z131 Encounter for screening for diabetes mellitus: Secondary | ICD-10-CM | POA: Diagnosis not present

## 2022-02-17 DIAGNOSIS — Z1231 Encounter for screening mammogram for malignant neoplasm of breast: Secondary | ICD-10-CM | POA: Diagnosis not present

## 2022-02-18 ENCOUNTER — Other Ambulatory Visit: Payer: Self-pay | Admitting: Family Medicine

## 2022-02-18 DIAGNOSIS — Z1231 Encounter for screening mammogram for malignant neoplasm of breast: Secondary | ICD-10-CM

## 2022-03-16 ENCOUNTER — Ambulatory Visit
Admission: EM | Admit: 2022-03-16 | Discharge: 2022-03-16 | Disposition: A | Discharge status: Home / Self Care | Payer: Managed Care, Other (non HMO)

## 2022-03-16 DIAGNOSIS — R059 Cough, unspecified: Secondary | ICD-10-CM | POA: Insufficient documentation

## 2022-03-16 DIAGNOSIS — B349 Viral infection, unspecified: Secondary | ICD-10-CM | POA: Insufficient documentation

## 2022-03-16 DIAGNOSIS — Z1152 Encounter for screening for COVID-19: Secondary | ICD-10-CM | POA: Diagnosis not present

## 2022-03-16 LAB — POCT RAPID STREP A (OFFICE): Rapid Strep A Screen: NEGATIVE

## 2022-03-16 MED ORDER — BENZONATATE 100 MG PO CAPS: 100.0000 mg | ORAL_CAPSULE | Freq: Three times a day (TID) | ORAL | 0 refills | Status: DC | PRN

## 2022-03-16 NOTE — ED Provider Notes (Signed)
[name]    CSN: [id number] Arrival date & time: 03/16/22  1639      History   Chief Complaint Chief Complaint  Patient presents with   Generalized Body Aches    HPI Wendy [name] is a 47 y.o. female.  Patient presents with headache, body aches, sore throat, congestion, cough x 3 days.  No fever, shortness of breath, vomiting, diarrhea, or other symptoms.  Treatment at home with Theraflu.  Her medical history includes anemia and gestational diabetes.   The history is provided by the patient and medical records.    Past Medical History:  Diagnosis Date   Anemia    Fibroid    Gestational diabetes     Patient Active Problem List   Diagnosis Date Noted   History of gestational diabetes 04/13/2020    Past Surgical History:  Procedure Laterality Date   CESAREAN SECTION     CESAREAN SECTION N/A 06/04/2020   Procedure: CESAREAN SECTION;  Surgeon: Janyth Pupa, DO;  Location: [facility];  Service: Obstetrics;  Laterality: N/A;   EYE SURGERY Bilateral    MYOMECTOMY ABDOMINAL APPROACH      OB History     Gravida  4   Para  2   Term  1   Preterm  1   AB  1   Living  1      SAB  1   IAB      Ectopic      Multiple      Live Births  1            Home Medications    Prior to Admission medications   Medication Sig Start Date End Date Taking? Authorizing Provider  benzonatate (TESSALON) 100 MG capsule Take 1 capsule (100 mg total) by mouth 3 (three) times daily as needed for cough. 03/16/22  Yes Sharion Balloon, NP  Accu-Chek Softclix Lancets lancets 1 each by Other route 4 (four) times daily. Patient not taking: Reported on 07/09/2020 04/13/20   Aletha Halim, MD  Blood Glucose Monitoring Suppl (ACCU-CHEK GUIDE) w/Device KIT 1 Device by Does not apply route 4 (four) times daily. Patient not taking: Reported on 07/09/2020 04/13/20   Aletha Halim, MD  ferrous sulfate (FERROUSUL) 325 (65 FE) MG tablet Take 1 tablet (325 mg total) by mouth every  other day. Patient not taking: Reported on 07/09/2020 04/13/20   Aletha Halim, MD  glucose blood (ACCU-CHEK GUIDE) test strip Use to check blood sugars four times a day was instructed Patient not taking: Reported on 07/09/2020 04/13/20   Aletha Halim, MD  ibuprofen (ADVIL) 600 MG tablet Take 1 tablet (600 mg total) by mouth every 6 (six) hours as needed. Patient not taking: Reported on 07/09/2020 06/07/20   Elvera Maria, CNM  levonorgestrel (MIRENA) 20 MCG/DAY IUD by Intrauterine route.    [provider]  ondansetron (ZOFRAN ODT) 4 MG disintegrating tablet Take 1 tablet (4 mg total) by mouth every 6 (six) hours as needed for nausea. Patient not taking: Reported on 07/09/2020 12/17/19   Osborne Oman, MD  oxyCODONE (OXY IR/ROXICODONE) 5 MG immediate release tablet Take 1-2 tablets (5-10 mg total) by mouth every 4 (four) hours as needed for moderate pain. Patient not taking: Reported on 07/09/2020 06/07/20   Concepcion Living, MD  Prenatal Vit-Fe Fumarate-FA (PREPLUS) 27-1 MG TABS Take 1 tablet by mouth daily. Patient not taking: Reported on 07/09/2020 12/17/19   Osborne Oman, MD  promethazine (PHENERGAN)  25 MG tablet Take 1 tablet (25 mg total) by mouth every 6 (six) hours as needed for nausea or vomiting. Patient not taking: Reported on 07/09/2020 12/17/19   Osborne Oman, MD    Family History Family History  Problem Relation Age of Onset   Diabetes Mother    Hypertension Father     Social History Social History   Tobacco Use   Smoking status: Never   Smokeless tobacco: Never  Vaping Use   Vaping Use: Never used  Substance Use Topics   Alcohol use: Never   Drug use: Never     Allergies   Patient has no known allergies.   Review of Systems Review of Systems  Constitutional:  Negative for chills and fever.  HENT:  Positive for congestion and sore throat. Negative for ear pain.   Respiratory:  Positive for cough. Negative for shortness of breath.    Cardiovascular:  Negative for chest pain and palpitations.  Gastrointestinal:  Negative for diarrhea and vomiting.  Skin:  Negative for color change and rash.  Neurological:  Positive for headaches.  All other systems reviewed and are negative.    Physical Exam Triage Vital Signs ED Triage Vitals  Enc Vitals Group     BP 03/16/22 1744 112/68     Pulse Rate 03/16/22 1729 97     Resp 03/16/22 1729 18     Temp 03/16/22 1729 98.8 F (37.1 C)     Temp src --      SpO2 03/16/22 1729 97 %     Weight 03/16/22 1742 105 lb (47.6 kg)     Height 03/16/22 1742 _0  (1.626 m)     Head Circumference --      Peak Flow --      Pain Score 03/16/22 1741 7     Pain Loc --      Pain Edu? --      Excl. in [place]? --    No data found.  Updated Vital Signs BP 112/68   Pulse 97   Temp 98.8 F (37.1 C)   Resp 18   Ht _1  (1.626 m)   Wt 105 lb (47.6 kg)   SpO2 97%   BMI 18.02 kg/m   Visual Acuity Right Eye Distance:   Left Eye Distance:   Bilateral Distance:    Right Eye Near:   Left Eye Near:    Bilateral Near:     Physical Exam Vitals and nursing note reviewed.  Constitutional:      General: She is not in acute distress.    Appearance: Normal appearance. She is well-developed. She is not ill-appearing.  HENT:     Right Ear: Tympanic membrane normal.     Left Ear: Tympanic membrane normal.     Nose: Nose normal.     Mouth/Throat:     Mouth: Mucous membranes are moist.     Pharynx: Posterior oropharyngeal erythema present.  Cardiovascular:     Rate and Rhythm: Normal rate and regular rhythm.     Heart sounds: Normal heart sounds.  Pulmonary:     Effort: Pulmonary effort is normal. No respiratory distress.     Breath sounds: Normal breath sounds.  Musculoskeletal:     Cervical back: Neck supple.  Skin:    General: Skin is warm and dry.  Neurological:     Mental Status: She is alert.  Psychiatric:        Mood and Affect: Mood normal.  Behavior: Behavior normal.       UC Treatments / Results  Labs (all labs ordered are listed, but only abnormal results are displayed) Labs Reviewed  SARS CORONAVIRUS 2 (TAT 6-24 HRS)  POCT RAPID STREP A (OFFICE)    EKG   Radiology No results found.  Procedures Procedures (including critical care time)  Medications Ordered in UC Medications - No data to display  Initial Impression / Assessment and Plan / UC Course  I have reviewed the triage vital signs and the nursing notes.  Pertinent labs & imaging results that were available during my care of the patient were reviewed by me and considered in my medical decision making (see chart for details).    Viral illness.  Rapid strep negative.  COVID pending.  Treating cough with Tessalon Perles.  Discussed symptomatic treatment including Tylenol, rest, hydration.  Instructed patient to follow up with her PCP if her symptoms are not improving.  She agrees to plan of care.   Final Clinical Impressions(s) / UC Diagnoses   Final diagnoses:  Viral illness     Discharge Instructions      Your strep test is negative.  Your COVID test is pending.    Take the [facility] as directed for cough.  Take Tylenol as needed for fever or discomfort.  Rest and keep yourself hydrated.    Follow-up with your primary care provider if your symptoms are not improving.         ED Prescriptions     Medication Sig Dispense Auth. Provider   benzonatate (TESSALON) 100 MG capsule Take 1 capsule (100 mg total) by mouth 3 (three) times daily as needed for cough. 21 capsule Sharion Balloon, NP      PDMP not reviewed this encounter.   Sharion Balloon, NP 03/16/22 1820

## 2022-03-16 NOTE — Discharge Instructions (Addendum)
Your strep test is negative.  Your COVID test is pending.    Take the [facility] as directed for cough.  Take Tylenol as needed for fever or discomfort.  Rest and keep yourself hydrated.    Follow-up with your primary care provider if your symptoms are not improving.

## 2022-03-16 NOTE — ED Triage Notes (Signed)
Patient to Urgent Care with complaints generalized body aches and headaches. Reports some chest congestion and pain with inhalation. Symptoms started over the weekend.  Denies any known fever.  Has been taking theraflu.

## 2022-03-17 LAB — SARS CORONAVIRUS 2 (TAT 6-24 HRS): SARS Coronavirus 2: NEGATIVE

## 2022-03-19 ENCOUNTER — Emergency Department
Admission: EM | Admit: 2022-03-19 | Discharge: 2022-03-19 | Disposition: A | Discharge status: Home / Self Care | Payer: Managed Care, Other (non HMO) | Attending: Emergency Medicine | Admitting: Emergency Medicine

## 2022-03-19 ENCOUNTER — Emergency Department: Payer: Managed Care, Other (non HMO)

## 2022-03-19 ENCOUNTER — Other Ambulatory Visit: Payer: Self-pay

## 2022-03-19 ENCOUNTER — Encounter: Payer: Self-pay | Admitting: Emergency Medicine

## 2022-03-19 DIAGNOSIS — Z1152 Encounter for screening for COVID-19: Secondary | ICD-10-CM | POA: Insufficient documentation

## 2022-03-19 DIAGNOSIS — E119 Type 2 diabetes mellitus without complications: Secondary | ICD-10-CM | POA: Insufficient documentation

## 2022-03-19 DIAGNOSIS — L52 Erythema nodosum: Secondary | ICD-10-CM | POA: Insufficient documentation

## 2022-03-19 DIAGNOSIS — J069 Acute upper respiratory infection, unspecified: Secondary | ICD-10-CM

## 2022-03-19 DIAGNOSIS — J029 Acute pharyngitis, unspecified: Secondary | ICD-10-CM | POA: Insufficient documentation

## 2022-03-19 DIAGNOSIS — R0789 Other chest pain: Secondary | ICD-10-CM | POA: Diagnosis not present

## 2022-03-19 LAB — CBC WITH DIFFERENTIAL/PLATELET
Abs Immature Granulocytes: 0.08 10*3/uL — ABNORMAL HIGH (ref 0.00–0.07)
Basophils Absolute: 0 10*3/uL (ref 0.0–0.1)
Basophils Relative: 0 %
Eosinophils Absolute: 0 10*3/uL (ref 0.0–0.5)
Eosinophils Relative: 0 %
HCT: 36.9 % (ref 36.0–46.0)
Hemoglobin: 12.3 g/dL (ref 12.0–15.0)
Immature Granulocytes: 1 %
Lymphocytes Relative: 12 %
Lymphs Abs: 1.7 10*3/uL (ref 0.7–4.0)
MCH: 28.7 pg (ref 26.0–34.0)
MCHC: 33.3 g/dL (ref 30.0–36.0)
MCV: 86 fL (ref 80.0–100.0)
Monocytes Absolute: 0.6 10*3/uL (ref 0.1–1.0)
Monocytes Relative: 4 %
Neutro Abs: 11.3 10*3/uL — ABNORMAL HIGH (ref 1.7–7.7)
Neutrophils Relative %: 83 %
Platelets: 229 10*3/uL (ref 150–400)
RBC: 4.29 MIL/uL (ref 3.87–5.11)
RDW: 12.1 % (ref 11.5–15.5)
WBC: 13.7 10*3/uL — ABNORMAL HIGH (ref 4.0–10.5)
nRBC: 0 % (ref 0.0–0.2)

## 2022-03-19 LAB — BASIC METABOLIC PANEL
Anion gap: 7 (ref 5–15)
BUN: 13 mg/dL (ref 6–20)
CO2: 23 mmol/L (ref 22–32)
Calcium: 8.9 mg/dL (ref 8.9–10.3)
Chloride: 104 mmol/L (ref 98–111)
Creatinine, Ser: 0.48 mg/dL (ref 0.44–1.00)
GFR, Estimated: >60 mL/min (ref >60)
Glucose, Bld: 155 mg/dL — ABNORMAL HIGH (ref 70–99)
Potassium: 3.5 mmol/L (ref 3.5–5.1)
Sodium: 134 mmol/L — ABNORMAL LOW (ref 135–145)

## 2022-03-19 LAB — TROPONIN I (HIGH SENSITIVITY): Troponin I (High Sensitivity): <2 ng/L (ref <18)

## 2022-03-19 LAB — RESP PANEL BY RT-PCR (RSV, FLU A&B, COVID)  RVPGX2
Influenza A by PCR: NEGATIVE
Influenza B by PCR: NEGATIVE
Resp Syncytial Virus by PCR: NEGATIVE
SARS Coronavirus 2 by RT PCR: NEGATIVE

## 2022-03-19 LAB — GROUP A STREP BY PCR: Group A Strep by PCR: NOT DETECTED

## 2022-03-19 LAB — CK: Total CK: 29 U/L — ABNORMAL LOW (ref 38–234)

## 2022-03-19 MED ORDER — KETOROLAC TROMETHAMINE 15 MG/ML IJ SOLN
15.0000 mg | Freq: Once | INTRAMUSCULAR | Status: AC
  Administered 2022-03-19: 15 mg via INTRAVENOUS
  Filled 2022-03-19: qty 1

## 2022-03-19 MED ORDER — PREDNISONE 10 MG (21) PO TBPK: ORAL_TABLET | ORAL | 0 refills | Status: DC

## 2022-03-19 NOTE — ED Notes (Addendum)
Toradal given in left deltoid

## 2022-03-19 NOTE — Discharge Instructions (Addendum)
Your COVID, flu, RSV, and strep are negative.  You may take the medication as prescribed to help with your symptoms on your legs.  Please return for any new, worsening, or change in symptoms or other concerns.  It was a pleasure caring for you today.

## 2022-03-19 NOTE — ED Triage Notes (Signed)
Pt via POV from home. Pt c/o generalized body aches, headache, sore throat, nasal congestion since Tuesday, pt was seen at [facility] on the same day. Swabbed for COVID and strep and both were negative. Pt is A&Ox4 and NAD

## 2022-03-19 NOTE — ED Provider Notes (Signed)
[facility] Provider Note    Event Date/Time   First MD Initiated Contact with Patient 03/19/22 1210     (approximate)   History   Sore Throat, Headache, and Generalized Body Aches   HPI  Wendy [name] is a 47 y.o. female with no reported past medical history presents today for evaluation of cough, body aches, nasal congestion, sore throat for the past 4-5 days.  She denies fever, shortness of breath, abdominal pain, vomiting, or diarrhea.  She reports that she has had some mild chest pain which she describes as muscle aches like the rest of her body.  She reports that she has pain in her legs and has developed painful lumps on both of her shins.  Patient Active Problem List   Diagnosis Date Noted   History of gestational diabetes 04/13/2020          Physical Exam   Triage Vital Signs: ED Triage Vitals [03/19/22 1122]  Enc Vitals Group     BP      Pulse      Resp      Temp      Temp src      SpO2      Weight 105 lb (47.6 kg)     Height '5\' 4"'$  (1.626 m)     Head Circumference      Peak Flow      Pain Score 7     Pain Loc      Pain Edu?      Excl. in [place]?     Most recent vital signs: Vitals:   03/19/22 1238 03/19/22 1434  BP: 113/80 110/71  Pulse: 90 98  Resp: 20 16  Temp: 98.3 F (36.8 C) 98 F (36.7 C)  SpO2: 100% 100%    Physical Exam Vitals and nursing note reviewed.  Constitutional:      General: Awake and alert. No acute distress.    Appearance: Normal appearance. The patient is normal weight.  HENT:     Head: Normocephalic and atraumatic.     Mouth: Mucous membranes are moist. Uvula midline.  No tonsillar exudate.  No soft palate fluctuance.  No trismus.  No voice change.  No sublingual swelling.  No tender cervical lymphadenopathy.  No nuchal rigidity Nasal congestion present Eyes:     General: PERRL. Normal EOMs        Right eye: No discharge.        Left eye: No discharge.     Conjunctiva/sclera: Conjunctivae  normal.  Cardiovascular:     Rate and Rhythm: Normal rate and regular rhythm.     Pulses: Normal pulses.  Pulmonary:     Effort: Pulmonary effort is normal. No respiratory distress.  Able to speak easily in complete sentences    Breath sounds: Normal breath sounds.  Abdominal:     Abdomen is soft. There is no abdominal tenderness. No rebound or guarding. No distention. Musculoskeletal:        General: No swelling. Normal range of motion.     Cervical back: Normal range of motion and neck supple.  No nuchal rigidity Right leg with a raised, painful, erythematous nodule without fluctuance.  2 similar lesions noted on shin of left leg.  No lymphangitis.  No crepitus.  No open wounds. Skin:    General: Skin is warm and dry.     Capillary Refill: Capillary refill takes less than 2 seconds.     Findings: No rash.  Neurological:  Mental Status: The patient is awake and alert.      ED Results / Procedures / Treatments   Labs (all labs ordered are listed, but only abnormal results are displayed) Labs Reviewed  CBC WITH DIFFERENTIAL/PLATELET - Abnormal; Notable for the following components:      Result Value   WBC 13.7 (*)    Neutro Abs 11.3 (*)    Abs Immature Granulocytes 0.08 (*)    All other components within normal limits  BASIC METABOLIC PANEL - Abnormal; Notable for the following components:   Sodium 134 (*)    Glucose, Bld 155 (*)    All other components within normal limits  CK - Abnormal; Notable for the following components:   Total CK 29 (*)    All other components within normal limits  RESP PANEL BY RT-PCR (RSV, FLU A&B, COVID)  [id number]  GROUP A STREP BY PCR  TROPONIN I (HIGH SENSITIVITY)     EKG     RADIOLOGY I independently reviewed and interpreted imaging and agree with radiologists findings.     PROCEDURES:  Critical Care performed:   Procedures   MEDICATIONS ORDERED IN ED: Medications  ketorolac (TORADOL) 15 MG/ML injection 15 mg (15 mg  Intravenous Given 03/19/22 1432)     IMPRESSION / MDM / ASSESSMENT AND PLAN / ED COURSE  I reviewed the triage vital signs and the nursing notes.   Differential diagnosis includes, but is not limited to, strep, influenza, COVID-19, other respiratory infection, erythema nodosum, insect bite, abscess.  Patient is awake and alert, hemodynamically stable and afebrile.  Symptoms of cough, sore throat, body aches, consistent with possible influenza or COVID or viral type illness.  COVID, flu, RSV are negative.  Labs reveal a mild leukocytosis, normal CPK and normal electrolytes and creatinine.  Chest x-ray was obtained and demonstrates no evidence of cardiopulmonary abnormality.  The nodules on her leg appear to be consistent with erythema nodosum.  No fluctuance to suggest abscess.  No crepitus.  No lymphangitis or cellulitic changes.  Suspect that these are reactive in the setting of viral infection.  Strep is negative.  There is no TB on x-ray.  No history of sarcoidosis.  Not taking the usual culprit drugs such as penicillins, exogenous hormones, sulfonamides, TNF alpha inhibitors, bromides or iodine's.  No fever to suggest occult bacterial infection.  No evidence of sepsis/SIRS.  No recent travel to suggest more occult infection such as fungal, no shortness of breath.  I recommended close outpatient follow-up with her outpatient provider.  Patient reports that her husband had similar findings on his shins and was prescribed prednisone and she would like to trial this given that she has been taking Tylenol and ibuprofen without significant improvement.  She was given a prednisone Dosepak.  We discussed at length the strict return precautions and the importance of very close outpatient follow-up.  Patient understands and agrees with plan.  She was discharged in stable condition.  Case was discussed with Dr. Quentin Cornwall who agrees with assessment and plan   Patient's presentation is most consistent with acute  complicated illness / injury requiring diagnostic workup.     FINAL CLINICAL IMPRESSION(S) / ED DIAGNOSES   Final diagnoses:  Upper respiratory tract infection, unspecified type  Erythema nodosum     Rx / DC Orders   ED Discharge Orders          Ordered    predniSONE (STERAPRED UNI-PAK 21 TAB) 10 MG (21) TBPK tablet  03/19/22 1413             Note:  This document was prepared using Dragon voice recognition software and may include unintentional dictation errors.   Emeline Gins 03/19/22 1443    Merlyn Lot, MD 03/19/22 [phone]

## 2022-03-19 NOTE — ED Notes (Signed)
Pt given d/c paperwork. Vitals stable.

## 2022-03-31 ENCOUNTER — Other Ambulatory Visit: Payer: Self-pay

## 2022-03-31 ENCOUNTER — Encounter: Payer: Self-pay | Admitting: Emergency Medicine

## 2022-03-31 ENCOUNTER — Emergency Department
Admission: EM | Admit: 2022-03-31 | Discharge: 2022-03-31 | Disposition: A | Discharge status: Home / Self Care | Payer: Managed Care, Other (non HMO) | Attending: Emergency Medicine | Admitting: Emergency Medicine

## 2022-03-31 DIAGNOSIS — L52 Erythema nodosum: Secondary | ICD-10-CM | POA: Diagnosis not present

## 2022-03-31 DIAGNOSIS — M79669 Pain in unspecified lower leg: Secondary | ICD-10-CM | POA: Diagnosis present

## 2022-03-31 LAB — CBC WITH DIFFERENTIAL/PLATELET
Abs Immature Granulocytes: 0.04 10*3/uL (ref 0.00–0.07)
Basophils Absolute: 0 10*3/uL (ref 0.0–0.1)
Basophils Relative: 0 %
Eosinophils Absolute: 0 10*3/uL (ref 0.0–0.5)
Eosinophils Relative: 0 %
HCT: 37.9 % (ref 36.0–46.0)
Hemoglobin: 12.7 g/dL (ref 12.0–15.0)
Immature Granulocytes: 1 %
Lymphocytes Relative: 24 %
Lymphs Abs: 1.9 10*3/uL (ref 0.7–4.0)
MCH: 28.5 pg (ref 26.0–34.0)
MCHC: 33.5 g/dL (ref 30.0–36.0)
MCV: 85 fL (ref 80.0–100.0)
Monocytes Absolute: 0.7 10*3/uL (ref 0.1–1.0)
Monocytes Relative: 8 %
Neutro Abs: 5.2 10*3/uL (ref 1.7–7.7)
Neutrophils Relative %: 67 %
Platelets: 320 10*3/uL (ref 150–400)
RBC: 4.46 MIL/uL (ref 3.87–5.11)
RDW: 12.4 % (ref 11.5–15.5)
WBC: 7.8 10*3/uL (ref 4.0–10.5)
nRBC: 0 % (ref 0.0–0.2)

## 2022-03-31 LAB — COMPREHENSIVE METABOLIC PANEL
ALT: 20 U/L (ref 0–44)
AST: 17 U/L (ref 15–41)
Albumin: 3.7 g/dL (ref 3.5–5.0)
Alkaline Phosphatase: 66 U/L (ref 38–126)
Anion gap: 6 (ref 5–15)
BUN: 13 mg/dL (ref 6–20)
CO2: 24 mmol/L (ref 22–32)
Calcium: 8.6 mg/dL — ABNORMAL LOW (ref 8.9–10.3)
Chloride: 104 mmol/L (ref 98–111)
Creatinine, Ser: 0.47 mg/dL (ref 0.44–1.00)
GFR, Estimated: >60 mL/min (ref >60)
Glucose, Bld: 78 mg/dL (ref 70–99)
Potassium: 3.7 mmol/L (ref 3.5–5.1)
Sodium: 134 mmol/L — ABNORMAL LOW (ref 135–145)
Total Bilirubin: 0.7 mg/dL (ref 0.3–1.2)
Total Protein: 7.6 g/dL (ref 6.5–8.1)

## 2022-03-31 LAB — C-REACTIVE PROTEIN: CRP: 1.6 mg/dL — ABNORMAL HIGH (ref <1.0)

## 2022-03-31 LAB — SEDIMENTATION RATE: Sed Rate: 45 mm/hr — ABNORMAL HIGH (ref 0–20)

## 2022-03-31 MED ORDER — NAPROXEN 500 MG PO TABS: 500.0000 mg | ORAL_TABLET | Freq: Two times a day (BID) | ORAL | 0 refills | Status: DC

## 2022-03-31 MED ORDER — NAPROXEN 500 MG PO TABS
500.0000 mg | ORAL_TABLET | Freq: Once | ORAL | Status: AC
  Administered 2022-03-31: 500 mg via ORAL
  Filled 2022-03-31: qty 1

## 2022-03-31 NOTE — ED Triage Notes (Signed)
Pt comes with c/op 2 weeks of bilateral leg pain. Pt states she came and got it checked and was placed on meds. Pt stats she has finished the course.

## 2022-03-31 NOTE — ED Provider Notes (Signed)
[facility] Provider Note    Event Date/Time   First MD Initiated Contact with Patient 03/31/22 1123     (approximate)   History   Leg Pain   HPI  Wendy [name] is a 47 y.o. female presents to the ED with complaint of continued lower leg discomfort.  Patient was seen in the emergency department on 03/19/2022 at which time it was felt that she had erythema nodosum and was placed on prednisone which she states did not relieve her symptoms.  On that particular visit she was also here for an upper respiratory infection which is now cleared.     Physical Exam   Triage Vital Signs: ED Triage Vitals  Enc Vitals Group     BP 03/31/22 1117 108/72     Pulse Rate 03/31/22 1117 72     Resp 03/31/22 1117 17     Temp 03/31/22 1117 98 F (36.7 C)     Temp src --      SpO2 03/31/22 1117 100 %     Weight 03/31/22 1126 105 lb 13.1 oz (48 kg)     Height 03/31/22 1126 '5\' 4"'$  (1.626 m)     Head Circumference --      Peak Flow --      Pain Score 03/31/22 1117 6     Pain Loc --      Pain Edu? --      Excl. in [place]? --     Most recent vital signs: Vitals:   03/31/22 1117  BP: 108/72  Pulse: 72  Resp: 17  Temp: 98 F (36.7 C)  SpO2: 100%     General: Awake, no distress.  CV:  Good peripheral perfusion.  Resp:  Normal effort.  Abd:  No distention.  Other:  Anterior bilateral lower leg with erythematous nodules that are tender to palpation.  No blanching.  Solid on palpation.   ED Results / Procedures / Treatments   Labs (all labs ordered are listed, but only abnormal results are displayed) Labs Reviewed  COMPREHENSIVE METABOLIC PANEL - Abnormal; Notable for the following components:      Result Value   Sodium 134 (*)    Calcium 8.6 (*)    All other components within normal limits  SEDIMENTATION RATE - Abnormal; Notable for the following components:   Sed Rate 45 (*)    All other components within normal limits  CBC WITH DIFFERENTIAL/PLATELET   C-REACTIVE PROTEIN       PROCEDURES:  Critical Care performed:   Procedures   MEDICATIONS ORDERED IN ED: Medications  naproxen (NAPROSYN) tablet 500 mg (500 mg Oral Given 03/31/22 1453)     IMPRESSION / MDM / ASSESSMENT AND PLAN / ED COURSE  I reviewed the triage vital signs and the nursing notes.   Differential diagnosis includes, but is not limited to, muscle skeletal pain, erythema nodosum.    47 year old female presents to the ED with complaint of bilateral leg pain which has continued since her ER visit on 03/19/2022.  At that time patient was given prednisone which she states did not make any difference.  Lab work was repeated today and WBC was actually improved over her last visit.  Patient was made aware that erythema nodosum needs to run its course and that the initial treatment is anti-inflammatories.  A prescription for naproxen 500 mg twice daily with food was sent to her pharmacy and she is to follow-up with her PCP at [facility]  Patient's presentation is most consistent with acute complicated illness / injury requiring diagnostic workup.  FINAL CLINICAL IMPRESSION(S) / ED DIAGNOSES   Final diagnoses:  Erythema nodosum     Rx / DC Orders   ED Discharge Orders          Ordered    naproxen (NAPROSYN) 500 MG tablet  2 times daily with meals        03/31/22 1450             Note:  This document was prepared using Dragon voice recognition software and may include unintentional dictation errors.   Johnn Hai, PA-C 03/31/22 1544    Lavonia Drafts, MD 03/31/22 [phone]

## 2022-03-31 NOTE — Discharge Instructions (Addendum)
Primary care provider to make an appointment for follow-up. Begin taking naproxen 500 mg twice daily with food.

## 2022-04-05 DIAGNOSIS — L52 Erythema nodosum: Secondary | ICD-10-CM | POA: Diagnosis not present

## 2022-05-09 DIAGNOSIS — R21 Rash and other nonspecific skin eruption: Secondary | ICD-10-CM | POA: Diagnosis not present

## 2022-05-09 DIAGNOSIS — L52 Erythema nodosum: Secondary | ICD-10-CM | POA: Diagnosis not present

## 2022-05-30 DIAGNOSIS — L52 Erythema nodosum: Secondary | ICD-10-CM | POA: Diagnosis not present

## 2022-07-15 ENCOUNTER — Ambulatory Visit
Admission: RE | Admit: 2022-07-15 | Discharge: 2022-07-15 | Disposition: A | Discharge status: Home / Self Care | Payer: Managed Care, Other (non HMO) | Source: Ambulatory Visit | Attending: Family Medicine | Admitting: Family Medicine

## 2022-07-15 DIAGNOSIS — Z1231 Encounter for screening mammogram for malignant neoplasm of breast: Secondary | ICD-10-CM | POA: Diagnosis present

## 2023-06-17 ENCOUNTER — Other Ambulatory Visit: Payer: Self-pay

## 2023-06-17 ENCOUNTER — Encounter: Payer: Self-pay | Admitting: Emergency Medicine

## 2023-06-17 ENCOUNTER — Emergency Department

## 2023-06-17 ENCOUNTER — Emergency Department
Admission: EM | Admit: 2023-06-17 | Discharge: 2023-06-17 | Disposition: A | Discharge status: Home / Self Care | Attending: Emergency Medicine | Admitting: Emergency Medicine

## 2023-06-17 DIAGNOSIS — R102 Pelvic and perineal pain: Secondary | ICD-10-CM | POA: Diagnosis not present

## 2023-06-17 LAB — COMPREHENSIVE METABOLIC PANEL WITH GFR
ALT: 16 U/L (ref 0–44)
AST: 20 U/L (ref 15–41)
Albumin: 3.7 g/dL (ref 3.5–5.0)
Alkaline Phosphatase: 47 U/L (ref 38–126)
Anion gap: 8 (ref 5–15)
BUN: 16 mg/dL (ref 6–20)
CO2: 24 mmol/L (ref 22–32)
Calcium: 9.2 mg/dL (ref 8.9–10.3)
Chloride: 106 mmol/L (ref 98–111)
Creatinine, Ser: 0.53 mg/dL (ref 0.44–1.00)
GFR, Estimated: >60 mL/min (ref >60)
Glucose, Bld: 148 mg/dL — ABNORMAL HIGH (ref 70–99)
Potassium: 3.5 mmol/L (ref 3.5–5.1)
Sodium: 138 mmol/L (ref 135–145)
Total Bilirubin: 0.9 mg/dL (ref 0.0–1.2)
Total Protein: 6.5 g/dL (ref 6.5–8.1)

## 2023-06-17 LAB — URINALYSIS, ROUTINE W REFLEX MICROSCOPIC
Bilirubin Urine: NEGATIVE
Glucose, UA: NEGATIVE mg/dL
Ketones, ur: NEGATIVE mg/dL
Leukocytes,Ua: NEGATIVE
Nitrite: NEGATIVE
Protein, ur: NEGATIVE mg/dL
Specific Gravity, Urine: 1.02 (ref 1.005–1.030)
pH: 6 (ref 5.0–8.0)

## 2023-06-17 LAB — CBC
HCT: 36.7 % (ref 36.0–46.0)
Hemoglobin: 12.9 g/dL (ref 12.0–15.0)
MCH: 29.4 pg (ref 26.0–34.0)
MCHC: 35.1 g/dL (ref 30.0–36.0)
MCV: 83.6 fL (ref 80.0–100.0)
Platelets: 214 10*3/uL (ref 150–400)
RBC: 4.39 MIL/uL (ref 3.87–5.11)
RDW: 12.5 % (ref 11.5–15.5)
WBC: 6.4 10*3/uL (ref 4.0–10.5)
nRBC: 0 % (ref 0.0–0.2)

## 2023-06-17 LAB — POC URINE PREG, ED: Preg Test, Ur: NEGATIVE

## 2023-06-17 LAB — CHLAMYDIA/NGC RT PCR (ARMC ONLY)
Chlamydia Tr: NOT DETECTED
N gonorrhoeae: NOT DETECTED

## 2023-06-17 LAB — LIPASE, BLOOD: Lipase: 28 U/L (ref 11–51)

## 2023-06-17 LAB — WET PREP, GENITAL
Clue Cells Wet Prep HPF POC: NONE SEEN
Sperm: NONE SEEN
Trich, Wet Prep: NONE SEEN
WBC, Wet Prep HPF POC: <10 (ref <10)
Yeast Wet Prep HPF POC: NONE SEEN

## 2023-06-17 NOTE — ED Provider Notes (Signed)
 [facility] Provider Note    Event Date/Time   First MD Initiated Contact with Patient 06/17/23 1245     (approximate)   History   Back Pain and Abdominal Cramping   HPI Wendy [name] is a 48 y.o. female presented today for lower abdominal pain.  Patient states for the past 2 weeks she has had intermittent low back pain and abdominal cramping.  She noticed it mostly on her left lower back and now having slight left lower quadrant abdominal pain.  She has also had new vaginal bleeding which she has not had in many years with IUD in place.  Bleeding is minimal to spotting at this time.  Also noting some brownish discharge.  She is sexually active.  Denies any blood in urine or dysuria.  No other abdominal pain.  Denies nausea or vomiting.  Prior abdominal surgeries including 2 C-sections as well as fibroid removal from her uterus.     Physical Exam   Triage Vital Signs: ED Triage Vitals  Encounter Vitals Group     BP 06/17/23 1053 109/73     Systolic BP Percentile --      Diastolic BP Percentile --      Pulse Rate 06/17/23 1053 74     Resp 06/17/23 1053 18     Temp 06/17/23 1053 98.3 F (36.8 C)     Temp Source 06/17/23 1053 Oral     SpO2 06/17/23 1053 98 %     Weight 06/17/23 1052 105 lb 13.1 oz (48 kg)     Height 06/17/23 1052 5\' 4"  (1.626 m)     Head Circumference --      Peak Flow --      Pain Score 06/17/23 1052 7     Pain Loc --      Pain Education --      Exclude from Growth Chart --     Most recent vital signs: Vitals:   06/17/23 1435 06/17/23 1447  BP: 122/81   Pulse: 70   Resp: 16   Temp:  98.5 F (36.9 C)  SpO2: 99%    Physical Exam: I have reviewed the vital signs and nursing notes. General: Awake, alert, no acute distress.  Nontoxic appearing. Head:  Atraumatic, normocephalic.   ENT:  EOM intact, PERRL. Oral mucosa is pink and moist with no lesions. Neck: Neck is supple with full range of motion, No meningeal  signs. Cardiovascular:  RRR, No murmurs. Peripheral pulses palpable and equal bilaterally. Respiratory:  Symmetrical chest wall expansion.  No rhonchi, rales, or wheezes.  Good air movement throughout.  No use of accessory muscles.   Musculoskeletal:  No cyanosis or edema. Moving extremities with full ROM Abdomen:  Soft, slight left lower quadrant tenderness to palpation, slight left-sided CVA tenderness, nondistended. Neuro:  GCS 15, moving all four extremities, interacting appropriately. Speech clear. Psych:  Calm, appropriate.   Skin:  Warm, dry, no rash.    ED Results / Procedures / Treatments   Labs (all labs ordered are listed, but only abnormal results are displayed) Labs Reviewed  COMPREHENSIVE METABOLIC PANEL WITH GFR - Abnormal; Notable for the following components:      Result Value   Glucose, Bld 148 (*)    All other components within normal limits  URINALYSIS, ROUTINE W REFLEX MICROSCOPIC - Abnormal; Notable for the following components:   Color, Urine YELLOW (*)    APPearance CLOUDY (*)    Hgb urine dipstick SMALL (*)  Bacteria, UA RARE (*)    All other components within normal limits  WET PREP, GENITAL  CHLAMYDIA/NGC RT PCR ([facility] ONLY)            LIPASE, BLOOD  CBC  POC URINE PREG, ED     EKG    RADIOLOGY Independently interpreted ultrasound with no acute pathology   PROCEDURES:  Critical Care performed: No  Procedures   MEDICATIONS ORDERED IN ED: Medications - No data to display   IMPRESSION / MDM / ASSESSMENT AND PLAN / ED COURSE  I reviewed the triage vital signs and the nursing notes.                              Differential diagnosis includes, but is not limited to, STI, acute cystitis, ovarian cyst, ovarian cyst rupture, lower suspicion for torsion, tubo-ovarian abscess, pyelonephritis, nephrolithiasis  Patient's presentation is most consistent with acute complicated illness / injury requiring diagnostic workup.  Patient is a  48 year old female presenting today for mild lower abdominal cramping.  She has very slight tenderness palpation in her left lower quadrant and associated with her vaginal bleeding and vaginal discharge.  CBC, CMP, lipase all unremarkable.  UA with small amount of hemoglobin but no infection.  Given her vaginal bleeding, will get transvaginal ultrasound as well as STI testing for further evaluation.  STI testing negative.  Transvaginal ultrasound shows evidence of low-lying IUD with question if it is starting to come out of the cervix.  This would certainly explain some lower pelvic pain and vaginal bleeding.  Will have patient follow-up with gynecology team outpatient.  She is agreeable with this and given strict return precautions.     FINAL CLINICAL IMPRESSION(S) / ED DIAGNOSES   Final diagnoses:  Pelvic pain in female     Rx / DC Orders   ED Discharge Orders          Ordered    Ambulatory referral to Gynecology        06/17/23 1550             Note:  This document was prepared using Dragon voice recognition software and may include unintentional dictation errors.   Janith Lima, MD 06/17/23 [phone]

## 2023-06-17 NOTE — Discharge Instructions (Addendum)
 No evidence of urinary tract infection.  Blood work otherwise looks reassuring.  Ultrasound of your uterus did show a low lying IUD which could potentially be starting to come out of your cervix.  I have sent a referral in for you to follow-up with gynecology outpatient.  You can call-year-old gynecologist and see if she can make a clinic appointment with them for follow-up.

## 2023-06-17 NOTE — ED Notes (Signed)
Pt resting comfortably, no needs expressed at this time.  

## 2023-06-17 NOTE — ED Triage Notes (Signed)
 Pt via POV from home. Pt c/o lower back pain that radiates to the lower abd. States its been going on a week. Denies urinary symptoms, denies NVD. States the pain feels like a cramping feel and some spotting. Pt has a IUD in place. Pt is A&OX4 and NAD

## 2023-06-17 NOTE — ED Notes (Signed)
 Pt in ultrasound

## 2023-06-20 ENCOUNTER — Other Ambulatory Visit (HOSPITAL_COMMUNITY)
Admission: RE | Admit: 2023-06-20 | Discharge: 2023-06-20 | Disposition: A | Discharge status: Home / Self Care | Source: Ambulatory Visit | Attending: Obstetrics & Gynecology | Admitting: Obstetrics & Gynecology

## 2023-06-20 ENCOUNTER — Encounter: Payer: Self-pay | Admitting: Obstetrics & Gynecology

## 2023-06-20 ENCOUNTER — Ambulatory Visit (INDEPENDENT_AMBULATORY_CARE_PROVIDER_SITE_OTHER): Admitting: Obstetrics & Gynecology

## 2023-06-20 VITALS — BP 101/68 | HR 69 | Wt 116.2 lb

## 2023-06-20 DIAGNOSIS — Z124 Encounter for screening for malignant neoplasm of cervix: Secondary | ICD-10-CM | POA: Diagnosis not present

## 2023-06-20 DIAGNOSIS — Z975 Presence of (intrauterine) contraceptive device: Secondary | ICD-10-CM | POA: Diagnosis present

## 2023-06-20 DIAGNOSIS — Z1151 Encounter for screening for human papillomavirus (HPV): Secondary | ICD-10-CM | POA: Insufficient documentation

## 2023-06-20 DIAGNOSIS — N921 Excessive and frequent menstruation with irregular cycle: Secondary | ICD-10-CM | POA: Insufficient documentation

## 2023-06-20 DIAGNOSIS — Z30431 Encounter for routine checking of intrauterine contraceptive device: Secondary | ICD-10-CM

## 2023-06-20 DIAGNOSIS — N939 Abnormal uterine and vaginal bleeding, unspecified: Secondary | ICD-10-CM | POA: Insufficient documentation

## 2023-06-20 DIAGNOSIS — T8332XA Displacement of intrauterine contraceptive device, initial encounter: Secondary | ICD-10-CM | POA: Diagnosis not present

## 2023-06-20 MED ORDER — MISOPROSTOL 200 MCG PO TABS: ORAL_TABLET | ORAL | 0 refills | Status: DC

## 2023-06-20 NOTE — Progress Notes (Signed)
 GYNECOLOGY OFFICE VISIT NOTE  History:   Wendy [name] is a 48 y.o. [phone] here today for report of AUB since 05/27/23, almost daily, ranging from spotting to light period.  Also reports left flank pain and left lower back pain.  She had the Mirena placed in 2022, no bleeding since then.  Seen in ED on 06/17/23, transvaginal ultrasound showed possible malpositioned IUD in lower uterine segment/upper cervix. Negative evaluation for vaginitis/cervical infection. She is here for follow up. Having spotting and still has left side pain.  She denies any or other concerns.    Past Medical History:  Diagnosis Date   Anemia    Fibroid    Gestational diabetes     Past Surgical History:  Procedure Laterality Date   CESAREAN SECTION     CESAREAN SECTION N/A 06/04/2020   Procedure: CESAREAN SECTION;  Surgeon: Myna Hidalgo, DO;  Location: [facility] LD ORS;  Service: Obstetrics;  Laterality: N/A;   EYE SURGERY Bilateral    MYOMECTOMY ABDOMINAL APPROACH      The following portions of the patient's history were reviewed and updated as appropriate: allergies, current medications, past family history, past medical history, past social history, past surgical history and problem list.   Health Maintenance:  Normal pap and negative HRHPV on 12/17/2019.  Normal mammogram on 07/15/2022.   Review of Systems:  Pertinent items noted in HPI and remainder of comprehensive ROS otherwise negative.  Physical Exam:  BP 101/68   Pulse 69   Wt 116 lb 3.2 oz (52.7 kg)   BMI 19.95 kg/m  CONSTITUTIONAL: Well-developed, well-nourished female in no acute distress.  HEENT:  Normocephalic, atraumatic. External right and left ear normal. No scleral icterus.  NECK: Normal range of motion, supple, no masses noted on observation SKIN: No rash noted. Not diaphoretic. No erythema. No pallor. MUSCULOSKELETAL: Normal range of motion. No edema noted. NEUROLOGIC: Alert and oriented to person, place, and time. Normal muscle tone  coordination. No cranial nerve deficit noted on observation. PSYCHIATRIC: Normal mood and affect. Normal behavior. Normal judgment and thought content. CARDIOVASCULAR: Normal heart rate noted RESPIRATORY: Effort and breath sounds normal, no problems with respiration noted ABDOMEN: Left flank pain/lateral mid abdominal pain.  No left pelvic pain noted.  No masses or other overt distention noted on observation.  PELVIC: Normal appearing external genitalia with absent clitoris (Type 1 FGM); normal urethral meatus; atrophic appearing vaginal mucosa and cervix.  Brown discharge noted. Pap smear obtained. Unable to see or palpated IUD strings in cervical canal.  Performed in the presence of a chaperone  Physical Exam Abdominal:       Comments: This is area of pain, outside the area of the uterus/adnexa region     Labs and Imaging Results for orders placed or performed during the hospital encounter of 06/17/23 (from the past week)  Lipase, blood   Collection Time: 06/17/23 11:05 AM  Result Value Ref Range   Lipase 28 11 - 51 U/L  Comprehensive metabolic panel   Collection Time: 06/17/23 11:05 AM  Result Value Ref Range   Sodium 138 135 - 145 mmol/L   Potassium 3.5 3.5 - 5.1 mmol/L   Chloride 106 98 - 111 mmol/L   CO2 24 22 - 32 mmol/L   Glucose, Bld 148 (H) 70 - 99 mg/dL   BUN 16 6 - 20 mg/dL   Creatinine, Ser 6.38 0.44 - 1.00 mg/dL   Calcium 9.2 8.9 - 75.6 mg/dL   Total Protein 6.5 6.5 - 8.1  g/dL   Albumin 3.7 3.5 - 5.0 g/dL   AST 20 15 - 41 U/L   ALT 16 0 - 44 U/L   Alkaline Phosphatase 47 38 - 126 U/L   Total Bilirubin 0.9 0.0 - 1.2 mg/dL   GFR, Estimated >55 >73 mL/min   Anion gap 8 5 - 15  CBC   Collection Time: 06/17/23 11:05 AM  Result Value Ref Range   WBC 6.4 4.0 - 10.5 K/uL   RBC 4.39 3.87 - 5.11 MIL/uL   Hemoglobin 12.9 12.0 - 15.0 g/dL   HCT 22.0 25.4 - 27.0 %   MCV 83.6 80.0 - 100.0 fL   MCH 29.4 26.0 - 34.0 pg   MCHC 35.1 30.0 - 36.0 g/dL   RDW 62.3 76.2 -  83.1 %   Platelets 214 150 - 400 K/uL   nRBC 0.0 0.0 - 0.2 %  Urinalysis, Routine w reflex microscopic -Urine, Clean Catch   Collection Time: 06/17/23 11:05 AM  Result Value Ref Range   Color, Urine YELLOW (A) YELLOW   APPearance CLOUDY (A) CLEAR   Specific Gravity, Urine 1.020 1.005 - 1.030   pH 6.0 5.0 - 8.0   Glucose, UA NEGATIVE NEGATIVE mg/dL   Hgb urine dipstick SMALL (A) NEGATIVE   Bilirubin Urine NEGATIVE NEGATIVE   Ketones, ur NEGATIVE NEGATIVE mg/dL   Protein, ur NEGATIVE NEGATIVE mg/dL   Nitrite NEGATIVE NEGATIVE   Leukocytes,Ua NEGATIVE NEGATIVE   RBC / HPF 0-5 0 - 5 RBC/hpf   WBC, UA 0-5 0 - 5 WBC/hpf   Bacteria, UA RARE (A) NONE SEEN   Squamous Epithelial / HPF 0-5 0 - 5 /HPF   WBC Clumps PRESENT    Mucus PRESENT    Amorphous Crystal PRESENT   POC urine preg, ED   Collection Time: 06/17/23 12:22 PM  Result Value Ref Range   Preg Test, Ur Negative Negative  Wet prep, genital   Collection Time: 06/17/23  1:00 PM   Specimen: Vaginal  Result Value Ref Range   Yeast Wet Prep HPF POC NONE SEEN NONE SEEN   Trich, Wet Prep NONE SEEN NONE SEEN   Clue Cells Wet Prep HPF POC NONE SEEN NONE SEEN   WBC, Wet Prep HPF POC <10 <10   Sperm NONE SEEN   Chlamydia/NGC rt PCR ([facility] only)   Collection Time: 06/17/23  1:00 PM   Specimen: Vaginal  Result Value Ref Range   Specimen source GC/Chlam ENDOCERVICAL    Chlamydia Tr NOT DETECTED NOT DETECTED   N gonorrhoeae NOT DETECTED NOT DETECTED   US PELVIC COMPLETE W TRANSVAGINAL AND TORSION R/O Result Date: 06/17/2023 CLINICAL DATA:  287709 Ovarian cyst rupture 287709. Vaginal bleeding for 1 week. Last menstrual period 05/20/2023 EXAM: TRANSABDOMINAL AND TRANSVAGINAL ULTRASOUND OF PELVIS DOPPLER ULTRASOUND OF OVARIES TECHNIQUE: Both transabdominal and transvaginal ultrasound examinations of the pelvis were performed. Transabdominal technique was performed for global imaging of the pelvis including uterus, ovaries, adnexal regions,  and pelvic cul-de-sac. It was necessary to proceed with endovaginal exam following the transabdominal exam to visualize the endometrium and bilateral ovaries. Color and duplex Doppler ultrasound was utilized to evaluate blood flow to the ovaries. COMPARISON:  None Available. FINDINGS: Uterus Measurements: 8.9 x 4.4 x 5.3 cm = volume: 108 mL. No fibroids or other mass visualized. Debris/fluid within the cervix. Endometrium Thickness: 3 mm. Poorly visualized T-shaped intrauterine device within the endometrium along the lower uterine segment and close to the cervix. Otherwise no focal abnormality visualized.  Right ovary Not visualized. Left ovary Measurements: 3 x 2 x 2 cm = volume: 6.4 mL. Normal appearance/no adnexal mass. Pulsed Doppler evaluation of both ovaries demonstrates normal low-resistance arterial and venous waveforms. Other findings No abnormal free fluid. IMPRESSION: 1. Poorly visualized T-shaped intrauterine device within the endometrium along the lower uterine segment and close to the cervix. Question low positioning. 2. Nonvisualized right ovary. Electronically Signed   By: Tish Frederickson M.D.   On: 06/17/2023 15:10      Assessment and Plan:     1. Abnormal uterine bleeding (AUB) (Primary) 2. Breakthrough bleeding associated with intrauterine device (IUD) Abnormal bleeding could be caused by malpositioned IUD, unable to palpate during examination.  Could also be perimenopausal bleeding.  Reassured by lack of uterine/endometrial pathology, endometrial thickness of 3 mm could also suggest bleeding due to atrophy.  Will follow up pap, already had negative cervicovaginitis testing on 06/17/23.  - Cytology - PAP  3. Malpositioned IUD, initial encounter Patient desires IUD removal. Recommended attempt of removal under ultrasound guidance here in office after misoprostol premedication. Discussed alternative of Hysteroscopic removal in another office [facility]) vs under anesthesia in OR, she wants to  try the ultrasound guided procedure first.  Advised premedication with vaginal misoprostol, and also taking oral Ibuprofen and Tylenol prior to procedure.   - misoprostol (CYTOTEC) 200 MCG tablet; Place two tablets in the vagina a few hours prior to your next clinic appointment in preparation for IUD removal  Dispense: 2 tablet; Refill: 0   Routine preventative health maintenance measures emphasized. Please refer to After Visit Summary for other counseling recommendations.   Return in about 1 week (around 06/27/2023) for Ultrasound guided IUD removal attempt (put on my schedule and Thayer Ohm).    I spent 35 minutes dedicated to the care of this patient including pre-visit review of records, face to face time with the patient discussing her conditions and treatments, post visit ordering of medications and appropriate tests or procedures, coordinating care and documenting this visit encounter.    Jaynie Collins, MD, FACOG Obstetrician & Gynecologist, [facility] for [employer], [facility]

## 2023-06-20 NOTE — Patient Instructions (Signed)
 Please take Tylenol 1000 mg and Ibuprofen 800 mg  one hour prior to procedure

## 2023-06-21 ENCOUNTER — Encounter: Payer: Self-pay | Admitting: Obstetrics & Gynecology

## 2023-06-21 LAB — CYTOLOGY - PAP
Comment: NEGATIVE
Diagnosis: NEGATIVE
High risk HPV: NEGATIVE

## 2023-06-27 ENCOUNTER — Other Ambulatory Visit

## 2023-06-27 ENCOUNTER — Encounter: Payer: Self-pay | Admitting: Obstetrics & Gynecology

## 2023-06-27 ENCOUNTER — Ambulatory Visit (INDEPENDENT_AMBULATORY_CARE_PROVIDER_SITE_OTHER): Admitting: Obstetrics & Gynecology

## 2023-06-27 VITALS — BP 110/69 | HR 90

## 2023-06-27 DIAGNOSIS — T8332XD Displacement of intrauterine contraceptive device, subsequent encounter: Secondary | ICD-10-CM | POA: Diagnosis not present

## 2023-06-27 DIAGNOSIS — Z30432 Encounter for removal of intrauterine contraceptive device: Secondary | ICD-10-CM

## 2023-06-27 DIAGNOSIS — Z538 Procedure and treatment not carried out for other reasons: Secondary | ICD-10-CM

## 2023-06-27 NOTE — Progress Notes (Signed)
    GYNECOLOGY OFFICE PROCEDURE NOTE  Wendy [name] is a 48 y.o. [id number] here for IUD removal attempt under ultrasound guidance.  Seen in ED on 06/17/23, transvaginal ultrasound showed possible malpositioned IUD in lower uterine segment/upper cervix. She premedicated with Ibuprofen and also placed vaginal misoprostol.  Plan to do this with paracervical block and under ultrasound guidance.  No GYN concerns.  Last pap smear was on 06/20/2023 and was normal.  IUD Removal  Patient identified, informed consent performed, consent signed.   Chaperone present.  Patient was placed in the dorsal lithotomy position, a narrow Pedersen speculum was placed in the patient's vagina, normal discharge was noted, no lesions. The cervix was visualized, no lesions, no abnormal discharge.  Patient was told that this will be done under ultrasound guidance, paracervical block will also be administered and she agreed to this. The strings of the IUD were not visualized. The cervix was swabbed with betadine x 3. 10 ml of plain 2% lidocaine was used a paracervical block.   Using bedside ultrasound guidance,  Kelly forceps were introduced into the endometrial cavity and an attempt was made to grasp the IUD . This was unsuccessful and was going to try a different instrument when patient said "I am dying.  I am dying now. Stop".  I immediately stopped and removed all instruments. She kept saying she was dying and that I should tell her husband and children she was sorry.  She was talking throughout, breathing well.  Vitals were done, showed initially an elevated HR in 120s and elevated BP 140/90s but normalized quickly.  She calmed down after about two minutes, and wanted me to try again which I refused to do. Advised to try under more anesthesia as it seemed she had a panic attack during the procedure. No presyncope or syncopal episode noted.  Message sent to [facility] surgical scheduler to schedule IUD removal in OR under anesthesia.  I do not  feel she will be able to tolerate office hysteroscopy well.   She was assured that there was no emergency to remove this, she does want this out soon, but was told that procedures are booked in about 2 months in future.  Bleeding precautions reviewed.  Patient was taken to waiting room in a wheelchair, went home with husband. She was in stable condition when she left.      Lenoard Rad, MD, FACOG Obstetrician & Gynecologist, [facility] for [employer], [facility]

## 2023-07-10 DIAGNOSIS — H5213 Myopia, bilateral: Secondary | ICD-10-CM | POA: Diagnosis not present

## 2023-07-28 ENCOUNTER — Telehealth: Payer: Self-pay

## 2023-07-28 NOTE — Telephone Encounter (Signed)
 I called patient to schedule surgery in July w/ Dr. Thurmon [place]. Patient states she is traveling in July and has already purchased her tickets. Patient would like to schedule once the August provider schedule is available.

## 2023-08-30 LAB — COLOGUARD: COLOGUARD: NEGATIVE

## 2023-10-26 ENCOUNTER — Telehealth: Payer: Self-pay

## 2023-10-26 NOTE — Telephone Encounter (Signed)
 I called patient to see if she's available for surgery w/ Dr. Herchel on 12/05/23 at 1 pm @[facility]. Patient agreed to surgery. Pre-op instructions and surgery details were provided by phone.

## 2023-10-30 DIAGNOSIS — E119 Type 2 diabetes mellitus without complications: Secondary | ICD-10-CM | POA: Diagnosis not present

## 2023-10-30 DIAGNOSIS — R052 Subacute cough: Secondary | ICD-10-CM | POA: Diagnosis not present

## 2023-11-01 ENCOUNTER — Encounter: Payer: Self-pay | Admitting: *Deleted

## 2023-11-28 ENCOUNTER — Encounter (HOSPITAL_COMMUNITY): Payer: Self-pay | Admitting: Obstetrics & Gynecology

## 2023-11-28 NOTE — Progress Notes (Signed)
 Spoke w/ via phone for pre-op interview--- Wendy [name] needs dos----   CBC and UPT per surgeon. A1C, BMP, CBG and EKG per anesthesia.      [name] results------ COVID test -----patient states asymptomatic no test needed Arrive at -------1045 NPO after MN NO Solid Food.  Clear liquids from MN until---0945 Pre-Surgery Ensure or G2:  Med rec completed Medications to take morning of surgery -----NONE Diabetic medication -----  GLP1 agonist last dose: GLP1 instructions:  Patient instructed no nail polish to be worn day of surgery Patient instructed to bring photo id and insurance card day of surgery Patient aware to have Driver (ride ) / caregiver    for 24 hours after surgery - [name] Patient Special Instructions ----- Pre-Op special Instructions -----  Patient verbalized understanding of instructions that were given at this phone interview. Patient denies chest pain, sob, fever, cough at the interview.

## 2023-12-05 ENCOUNTER — Encounter (HOSPITAL_COMMUNITY): Payer: Self-pay | Admitting: Obstetrics & Gynecology

## 2023-12-05 ENCOUNTER — Other Ambulatory Visit (HOSPITAL_COMMUNITY): Payer: Self-pay

## 2023-12-05 ENCOUNTER — Ambulatory Visit (HOSPITAL_COMMUNITY)
Admission: RE | Admit: 2023-12-05 | Discharge: 2023-12-05 | Disposition: A | Discharge status: Home / Self Care | Attending: Obstetrics & Gynecology | Admitting: Obstetrics & Gynecology

## 2023-12-05 ENCOUNTER — Ambulatory Visit (HOSPITAL_BASED_OUTPATIENT_CLINIC_OR_DEPARTMENT_OTHER)

## 2023-12-05 ENCOUNTER — Ambulatory Visit (HOSPITAL_COMMUNITY)

## 2023-12-05 ENCOUNTER — Other Ambulatory Visit: Payer: Self-pay

## 2023-12-05 ENCOUNTER — Encounter (HOSPITAL_COMMUNITY)
Admission: RE | Disposition: A | Discharge status: Home / Self Care | Payer: Self-pay | Source: Home / Self Care | Attending: Obstetrics & Gynecology

## 2023-12-05 DIAGNOSIS — E119 Type 2 diabetes mellitus without complications: Secondary | ICD-10-CM

## 2023-12-05 DIAGNOSIS — N840 Polyp of corpus uteri: Secondary | ICD-10-CM

## 2023-12-05 DIAGNOSIS — N939 Abnormal uterine and vaginal bleeding, unspecified: Secondary | ICD-10-CM | POA: Diagnosis present

## 2023-12-05 DIAGNOSIS — N938 Other specified abnormal uterine and vaginal bleeding: Secondary | ICD-10-CM

## 2023-12-05 DIAGNOSIS — T8332XA Displacement of intrauterine contraceptive device, initial encounter: Secondary | ICD-10-CM | POA: Diagnosis present

## 2023-12-05 DIAGNOSIS — T8332XD Displacement of intrauterine contraceptive device, subsequent encounter: Secondary | ICD-10-CM

## 2023-12-05 DIAGNOSIS — Y762 Prosthetic and other implants, materials and accessory obstetric and gynecological devices associated with adverse incidents: Secondary | ICD-10-CM | POA: Insufficient documentation

## 2023-12-05 DIAGNOSIS — T8339XA Other mechanical complication of intrauterine contraceptive device, initial encounter: Secondary | ICD-10-CM

## 2023-12-05 DIAGNOSIS — N888 Other specified noninflammatory disorders of cervix uteri: Secondary | ICD-10-CM | POA: Insufficient documentation

## 2023-12-05 HISTORY — PX: HYSTEROSCOPY WITH IMPACTED FOREIGN BODY REMOVAL: SHX7590

## 2023-12-05 HISTORY — PX: MYOSURE RESECTION: SHX7611

## 2023-12-05 HISTORY — DX: Type 2 diabetes mellitus without complications: E11.9

## 2023-12-05 LAB — BASIC METABOLIC PANEL WITH GFR
Anion gap: 11 (ref 5–15)
BUN: 9 mg/dL (ref 6–20)
CO2: 22 mmol/L (ref 22–32)
Calcium: 9.1 mg/dL (ref 8.9–10.3)
Chloride: 106 mmol/L (ref 98–111)
Creatinine, Ser: 0.43 mg/dL — ABNORMAL LOW (ref 0.44–1.00)
GFR, Estimated: >60 mL/min (ref >60)
Glucose, Bld: 107 mg/dL — ABNORMAL HIGH (ref 70–99)
Potassium: 3.7 mmol/L (ref 3.5–5.1)
Sodium: 139 mmol/L (ref 135–145)

## 2023-12-05 LAB — POCT PREGNANCY, URINE: Preg Test, Ur: NEGATIVE

## 2023-12-05 LAB — HEMOGLOBIN A1C
Hgb A1c MFr Bld: 6 % — ABNORMAL HIGH (ref 4.8–5.6)
Mean Plasma Glucose: 125.5 mg/dL

## 2023-12-05 LAB — GLUCOSE, CAPILLARY
Glucose-Capillary: 104 mg/dL — ABNORMAL HIGH (ref 70–99)
Glucose-Capillary: 73 mg/dL (ref 70–99)

## 2023-12-05 LAB — CBC
HCT: 42.6 % (ref 36.0–46.0)
Hemoglobin: 13.8 g/dL (ref 12.0–15.0)
MCH: 29 pg (ref 26.0–34.0)
MCHC: 32.4 g/dL (ref 30.0–36.0)
MCV: 89.5 fL (ref 80.0–100.0)
Platelets: 150 K/uL (ref 150–400)
RBC: 4.76 MIL/uL (ref 3.87–5.11)
RDW: 13 % (ref 11.5–15.5)
WBC: 5.5 K/uL (ref 4.0–10.5)
nRBC: 0 % (ref 0.0–0.2)

## 2023-12-05 SURGERY — HYSTEROSCOPY WITH IMPACTED FOREIGN BODY REMOVAL
Anesthesia: General

## 2023-12-05 MED ORDER — FENTANYL CITRATE (PF) 250 MCG/5ML IJ SOLN
INTRAMUSCULAR | Status: DC | PRN
  Administered 2023-12-05: 50 ug via INTRAVENOUS

## 2023-12-05 MED ORDER — DEXAMETHASONE SODIUM PHOSPHATE 10 MG/ML IJ SOLN
INTRAMUSCULAR | Status: DC | PRN
  Administered 2023-12-05: 8 mg via INTRAVENOUS

## 2023-12-05 MED ORDER — PROPOFOL 10 MG/ML IV BOLUS
INTRAVENOUS | Status: AC
Start: 2023-12-05 — End: 2023-12-05
  Filled 2023-12-05: qty 20

## 2023-12-05 MED ORDER — ACETAMINOPHEN 500 MG PO TABS
1000.0000 mg | ORAL_TABLET | ORAL | Status: AC
  Administered 2023-12-05: 1000 mg via ORAL

## 2023-12-05 MED ORDER — OXYCODONE HCL 5 MG PO TABS
5.0000 mg | ORAL_TABLET | Freq: Once | ORAL | Status: AC | PRN
  Administered 2023-12-05: 5 mg via ORAL

## 2023-12-05 MED ORDER — LIDOCAINE 2% (20 MG/ML) 5 ML SYRINGE
INTRAMUSCULAR | Status: AC
  Filled 2023-12-05: qty 5

## 2023-12-05 MED ORDER — ACETAMINOPHEN 500 MG PO TABS: 1000.0000 mg | ORAL_TABLET | Freq: Four times a day (QID) | ORAL | Status: AC | PRN

## 2023-12-05 MED ORDER — ONDANSETRON HCL 4 MG/2ML IJ SOLN
INTRAMUSCULAR | Status: DC | PRN
  Administered 2023-12-05: 4 mg via INTRAVENOUS

## 2023-12-05 MED ORDER — DROSPIRENONE-ETHINYL ESTRADIOL 3-0.02 MG PO TABS
1.0000 | ORAL_TABLET | Freq: Every day | ORAL | 11 refills | Status: DC
  Filled 2023-12-05: qty 28, 28d supply, fill #0

## 2023-12-05 MED ORDER — FENTANYL CITRATE (PF) 250 MCG/5ML IJ SOLN
INTRAMUSCULAR | Status: AC
  Filled 2023-12-05: qty 5

## 2023-12-05 MED ORDER — KETOROLAC TROMETHAMINE 30 MG/ML IJ SOLN
INTRAMUSCULAR | Status: AC
  Filled 2023-12-05: qty 1

## 2023-12-05 MED ORDER — PROPOFOL 10 MG/ML IV BOLUS
INTRAVENOUS | Status: DC | PRN
  Administered 2023-12-05: 200 mg via INTRAVENOUS

## 2023-12-05 MED ORDER — DEXAMETHASONE SODIUM PHOSPHATE 10 MG/ML IJ SOLN
INTRAMUSCULAR | Status: AC
  Filled 2023-12-05: qty 1

## 2023-12-05 MED ORDER — EPHEDRINE SULFATE-NACL 50-0.9 MG/10ML-% IV SOSY
PREFILLED_SYRINGE | INTRAVENOUS | Status: DC | PRN
  Administered 2023-12-05: 10 mg via INTRAVENOUS

## 2023-12-05 MED ORDER — ONDANSETRON HCL 4 MG/2ML IJ SOLN
INTRAMUSCULAR | Status: AC
  Filled 2023-12-05: qty 2

## 2023-12-05 MED ORDER — LACTATED RINGERS IV SOLN: INTRAVENOUS | Status: DC

## 2023-12-05 MED ORDER — SODIUM CHLORIDE 0.9 % IR SOLN
Status: DC | PRN
  Administered 2023-12-05: 3000 mL

## 2023-12-05 MED ORDER — NAPROXEN SODIUM 220 MG PO TABS: 550.0000 mg | ORAL_TABLET | Freq: Two times a day (BID) | ORAL | Status: AC | PRN

## 2023-12-05 MED ORDER — CHLORHEXIDINE GLUCONATE 0.12 % MT SOLN
15.0000 mL | Freq: Once | OROMUCOSAL | Status: AC
  Administered 2023-12-05: 15 mL via OROMUCOSAL

## 2023-12-05 MED ORDER — PHENYLEPHRINE 80 MCG/ML (10ML) SYRINGE FOR IV PUSH (FOR BLOOD PRESSURE SUPPORT)
PREFILLED_SYRINGE | INTRAVENOUS | Status: DC | PRN
  Administered 2023-12-05: 80 ug via INTRAVENOUS

## 2023-12-05 MED ORDER — MIDAZOLAM HCL 2 MG/2ML IJ SOLN
INTRAMUSCULAR | Status: AC
  Filled 2023-12-05: qty 2

## 2023-12-05 MED ORDER — CELECOXIB 200 MG PO CAPS
ORAL_CAPSULE | ORAL | Status: AC
  Filled 2023-12-05: qty 2

## 2023-12-05 MED ORDER — SODIUM CHLORIDE 0.9 % IV SOLN: 12.5000 mg | INTRAVENOUS | Status: DC | PRN

## 2023-12-05 MED ORDER — MIDAZOLAM HCL 2 MG/2ML IJ SOLN
INTRAMUSCULAR | Status: DC | PRN
  Administered 2023-12-05: 1 mg via INTRAVENOUS

## 2023-12-05 MED ORDER — LIDOCAINE 2% (20 MG/ML) 5 ML SYRINGE
INTRAMUSCULAR | Status: DC | PRN
  Administered 2023-12-05: 60 mg via INTRAVENOUS

## 2023-12-05 MED ORDER — ACETAMINOPHEN 500 MG PO TABS
ORAL_TABLET | ORAL | Status: AC
  Filled 2023-12-05: qty 2

## 2023-12-05 MED ORDER — PROPOFOL 10 MG/ML IV BOLUS
INTRAVENOUS | Status: AC
  Filled 2023-12-05: qty 20

## 2023-12-05 MED ORDER — CELECOXIB 200 MG PO CAPS
400.0000 mg | ORAL_CAPSULE | ORAL | Status: AC
  Administered 2023-12-05: 400 mg via ORAL

## 2023-12-05 MED ORDER — MEPERIDINE HCL 25 MG/ML IJ SOLN: 6.2500 mg | INTRAMUSCULAR | Status: DC | PRN

## 2023-12-05 MED ORDER — BUPIVACAINE HCL (PF) 0.5 % IJ SOLN
INTRAMUSCULAR | Status: AC
Start: 2023-12-05 — End: 2023-12-05
  Filled 2023-12-05: qty 60

## 2023-12-05 MED ORDER — OXYCODONE HCL 5 MG/5ML PO SOLN: 5.0000 mg | Freq: Once | ORAL | Status: AC | PRN

## 2023-12-05 MED ORDER — OXYCODONE HCL 5 MG PO TABS
ORAL_TABLET | ORAL | Status: AC
  Filled 2023-12-05: qty 1

## 2023-12-05 MED ORDER — BUPIVACAINE HCL 0.5 % IJ SOLN
INTRAMUSCULAR | Status: DC | PRN
  Administered 2023-12-05: 30 mL

## 2023-12-05 MED ORDER — ORAL CARE MOUTH RINSE: 15.0000 mL | Freq: Once | OROMUCOSAL | Status: AC

## 2023-12-05 MED ORDER — AMISULPRIDE (ANTIEMETIC) 5 MG/2ML IV SOLN: 10.0000 mg | Freq: Once | INTRAVENOUS | Status: DC | PRN

## 2023-12-05 MED ORDER — GABAPENTIN 300 MG PO CAPS
300.0000 mg | ORAL_CAPSULE | ORAL | Status: AC
  Administered 2023-12-05: 300 mg via ORAL

## 2023-12-05 MED ORDER — CHLORHEXIDINE GLUCONATE 0.12 % MT SOLN
OROMUCOSAL | Status: AC
  Filled 2023-12-05: qty 15

## 2023-12-05 MED ORDER — OXYCODONE HCL 5 MG PO TABS
5.0000 mg | ORAL_TABLET | Freq: Four times a day (QID) | ORAL | 0 refills | Status: AC | PRN
  Filled 2023-12-05: qty 10, 3d supply, fill #0

## 2023-12-05 MED ORDER — HYDROMORPHONE HCL 1 MG/ML IJ SOLN: 0.2500 mg | INTRAMUSCULAR | Status: DC | PRN

## 2023-12-05 MED ORDER — GABAPENTIN 300 MG PO CAPS
ORAL_CAPSULE | ORAL | Status: AC
  Filled 2023-12-05: qty 1

## 2023-12-05 SURGICAL SUPPLY — 14 items
CATH ROBINSON RED A/P 16FR (CATHETERS) ×2 IMPLANT
DEVICE MYOSURE LITE (MISCELLANEOUS) IMPLANT
DEVICE MYOSURE REACH (MISCELLANEOUS) IMPLANT
GLOVE ECLIPSE 7.0 STRL STRAW (GLOVE) ×2 IMPLANT
GLOVE SURG UNDER POLY LF SZ7 (GLOVE) ×4 IMPLANT
GOWN STRL REUS W/ TWL XL LVL3 (GOWN DISPOSABLE) ×2 IMPLANT
KIT PROCEDURE FLUENT (KITS) ×2 IMPLANT
KIT TURNOVER KIT B (KITS) ×2 IMPLANT
PACK VAGINAL MINOR WOMEN LF (CUSTOM PROCEDURE TRAY) ×2 IMPLANT
PAD OB MATERNITY 11 LF (PERSONAL CARE ITEMS) ×2 IMPLANT
SEAL ROD LENS SCOPE MYOSURE (ABLATOR) ×2 IMPLANT
SOL .9 NS 3000ML IRR UROMATIC (IV SOLUTION) IMPLANT
TOWEL GREEN STERILE FF (TOWEL DISPOSABLE) ×2 IMPLANT
UNDERPAD 30X36 HEAVY ABSORB (UNDERPADS AND DIAPERS) ×2 IMPLANT

## 2023-12-05 NOTE — Transfer of Care (Signed)
 Immediate Anesthesia Transfer of Care Note  Patient: Wendy [name]  Procedure(s) Performed: DILATION AND CURRETAGE/ HYSTEROSCOPY WITH IUD REMOVAL POLYPECTOMY WITH MYOSURE RESECTION  Patient Location: PACU  Anesthesia Type:General  Level of Consciousness: drowsy  Airway & Oxygen Therapy: Patient Spontanous Breathing  Post-op Assessment: Report given to RN and Post -op Vital signs reviewed and stable  Post vital signs: Reviewed and stable  Last Vitals:  Vitals Value Taken Time  BP 119/83 12/05/23 14:45  Temp 36.4 C 12/05/23 14:39  Pulse 66 12/05/23 14:46  Resp 11 12/05/23 14:46  SpO2 100 % 12/05/23 14:46  Vitals shown include unfiled device data.  Last Pain:  Vitals:   12/05/23 1046  TempSrc: Oral  PainSc: 0-No pain      Patients Stated Pain Goal: 5 (12/05/23 1046)  Complications: No notable events documented.

## 2023-12-05 NOTE — Anesthesia Procedure Notes (Signed)
 Procedure Name: LMA Insertion Date/Time: 12/05/2023 1:50 PM  Performed by: Boyce Shilling, CRNAPre-anesthesia Checklist: Patient identified, Emergency Drugs available, Suction available, Timeout performed and Patient being monitored Patient Re-evaluated:Patient Re-evaluated prior to induction Oxygen Delivery Method: Circle system utilized Preoxygenation: Pre-oxygenation with 100% oxygen Induction Type: IV induction Ventilation: Mask ventilation without difficulty LMA: LMA inserted LMA Size: 4.0 Tube type: Oral Number of attempts: 1 Placement Confirmation: positive ETCO2, CO2 detector and breath sounds checked- equal and bilateral Tube secured with: Tape Dental Injury: Teeth and Oropharynx as per pre-operative assessment

## 2023-12-05 NOTE — Discharge Instructions (Signed)

## 2023-12-05 NOTE — Anesthesia Postprocedure Evaluation (Signed)
 Anesthesia Post Note  Patient: Wendy [name]  Procedure(s) Performed: DILATION AND CURRETAGE/ HYSTEROSCOPY WITH IUD REMOVAL POLYPECTOMY WITH MYOSURE RESECTION     Patient location during evaluation: PACU Anesthesia Type: General Level of consciousness: awake and alert Pain management: pain level controlled Vital Signs Assessment: post-procedure vital signs reviewed and stable Respiratory status: spontaneous breathing, nonlabored ventilation and respiratory function stable Cardiovascular status: blood pressure returned to baseline and stable Postop Assessment: no apparent nausea or vomiting Anesthetic complications: no   No notable events documented.  Last Vitals:  Vitals:   12/05/23 1046 12/05/23 1439  BP: 118/79   Pulse: 75 78  Resp: 17 16  Temp: 36.8 C 36.4 C  SpO2: 100% 99%    Last Pain:  Vitals:   12/05/23 1611  TempSrc:   PainSc: 4                  Butler Levander Pinal

## 2023-12-05 NOTE — H&P (Signed)
 Preoperative History and Physical  Wendy [name] is a 48 y.o. [id number] here for surgical management of malpositioned IUD after failed attempt of in office removal.  Also had reported abnormal uterine bleeding, needs endometrial sampling.  No significant preoperative concerns.  Proposed surgery: Hysteroscopy, dilation and curettage, removal of malpositioned IUD  Past Medical History:  Diagnosis Date   Anemia    Fibroid    Gestational diabetes    Past Surgical History:  Procedure Laterality Date   CESAREAN SECTION     CESAREAN SECTION N/A 06/04/2020   Procedure: CESAREAN SECTION;  Surgeon: Ozan, Jennifer, DO;  [facility]: [facility];  Service: Obstetrics;  Laterality: N/A;   EYE SURGERY Bilateral    MYOMECTOMY ABDOMINAL APPROACH     OB History  Gravida Para Term Preterm AB Living  4 2 1 1 1 1   SAB IAB Ectopic Multiple Live Births  1    1    # Outcome Date GA Lbr Len/2nd Weight Sex Type Anes PTL Lv  4 Gravida           3 SAB 07/2019 [redacted]w[redacted]d         2 Preterm 08/2017 [redacted]w[redacted]d         1 Term 12/13/10    F CS-LTranv EPI N LIV  Patient denies any other pertinent gynecologic issues.   No current facility-administered medications on file prior to encounter.   Current Outpatient Medications on File Prior to Encounter  Medication Sig Dispense Refill   Accu-Chek Softclix Lancets lancets 1 each by Other route 4 (four) times daily. (Patient not taking: Reported on 07/09/2020) 100 each 12   Blood Glucose Monitoring Suppl (ACCU-CHEK GUIDE) w/Device KIT 1 Device by Does not apply route 4 (four) times daily. (Patient not taking: Reported on 07/09/2020) 1 kit 0   ferrous sulfate  (FERROUSUL) 325 (65 FE) MG tablet Take 1 tablet (325 mg total) by mouth every other day. (Patient not taking: Reported on 07/09/2020) 30 tablet 1   glucose blood (ACCU-CHEK GUIDE) test strip Use to check blood sugars four times a day was instructed (Patient not taking: Reported on 07/09/2020) 50 each 12   levonorgestrel  (MIRENA )  20 MCG/DAY IUD by Intrauterine route.     naproxen  (NAPROSYN ) 500 MG tablet Take 1 tablet (500 mg total) by mouth 2 (two) times daily with a meal. 30 tablet 0   Prenatal Vit-Fe Fumarate-FA (PREPLUS) 27-1 MG TABS Take 1 tablet by mouth daily. (Patient not taking: Reported on 07/09/2020) 30 tablet 13   No Known Allergies  Social History:   reports that she has never smoked. She has never used smokeless tobacco. She reports that she does not drink alcohol and does not use drugs.  Family History  Problem Relation Age of Onset   Diabetes Mother    Hypertension Father    Breast cancer Neg Hx     Review of Systems: Pertinent items noted in HPI and remainder of comprehensive ROS otherwise negative.  PHYSICAL EXAM: Blood pressure 118/79, pulse 75, temperature 98.2 F (36.8 C), temperature source Oral, resp. rate 17, height 5' 4 (1.626 m), weight 55.8 kg, SpO2 100%, not currently breastfeeding. CONSTITUTIONAL: Well-developed, well-nourished female in no acute distress.  HENT:  Normocephalic, atraumatic, External right and left ear normal. Oropharynx is clear and moist EYES: Conjunctivae and EOM are normal. Pupils are equal, round, and reactive to light. No scleral icterus.  NECK: Normal range of motion, supple, no masses SKIN: Skin is warm and dry. No rash  noted. Not diaphoretic. No erythema. No pallor. NEUROLOGIC: Alert and oriented to person, place, and time. Normal reflexes, muscle tone coordination. No cranial nerve deficit noted. PSYCHIATRIC: Normal mood and affect. Normal behavior. Normal judgment and thought content. CARDIOVASCULAR: Normal heart rate noted, regular rhythm RESPIRATORY: Effort and breath sounds normal, no problems with respiration noted ABDOMEN: Soft, nontender, nondistended. PELVIC: Deferred MUSCULOSKELETAL: Normal range of motion. No edema and no tenderness. 2+ distal pulses.  Labs: Results for orders placed or performed during the hospital encounter of 12/05/23 (from  the past 2 weeks)  Pregnancy, urine POC   Collection Time: 12/05/23 10:44 AM  Result Value Ref Range   Preg Test, Ur NEGATIVE NEGATIVE  CBC   Collection Time: 12/05/23 11:00 AM  Result Value Ref Range   WBC 5.5 4.0 - 10.5 K/uL   RBC 4.76 3.87 - 5.11 MIL/uL   Hemoglobin 13.8 12.0 - 15.0 g/dL   HCT 57.3 63.9 - 53.9 %   MCV 89.5 80.0 - 100.0 fL   MCH 29.0 26.0 - 34.0 pg   MCHC 32.4 30.0 - 36.0 g/dL   RDW 86.9 88.4 - 84.4 %   Platelets 150 150 - 400 K/uL   nRBC 0.0 0.0 - 0.2 %  Hemoglobin A1c per protocol   Collection Time: 12/05/23 11:00 AM  Result Value Ref Range   Hgb A1c MFr Bld 6.0 (H) 4.8 - 5.6 %   Mean Plasma Glucose 125.5 mg/dL  Basic metabolic panel per protocol   Collection Time: 12/05/23 11:00 AM  Result Value Ref Range   Sodium 139 135 - 145 mmol/L   Potassium 3.7 3.5 - 5.1 mmol/L   Chloride 106 98 - 111 mmol/L   CO2 22 22 - 32 mmol/L   Glucose, Bld 107 (H) 70 - 99 mg/dL   BUN 9 6 - 20 mg/dL   Creatinine, Ser 9.56 (L) 0.44 - 1.00 mg/dL   Calcium 9.1 8.9 - 89.6 mg/dL   GFR, Estimated >39 >39 mL/min   Anion gap 11 5 - 15  Glucose, capillary   Collection Time: 12/05/23 11:48 AM  Result Value Ref Range   Glucose-Capillary 104 (H) 70 - 99 mg/dL    Imaging Studies: No results found.  Assessment: Principal Problem:   Malpositioned intrauterine device (IUD) Active Problems:   Abnormal uterine bleeding (AUB)   Plan: Patient will undergo surgical management with Hysteroscopy, dilation and curettage, removal of malpositioned IUD.   The risks of surgery were discussed in detail with the patient including but not limited to: bleeding which may require transfusion or reoperation; infection which may require antibiotics; injury to surrounding organs which may involve uterus, bowel, bladder, ureters; need for additional procedures including laparoscopy or laparotomy or subsequent procedures secondary to abnormal pathology; thromboembolic phenomenon, surgical site problems  and other postoperative/anesthesia complications. Likelihood of success in alleviating the patient's condition was discussed. Routine postoperative instructions will be reviewed with the patient and her family in detail after surgery.  The patient concurred with the proposed plan, giving informed written consent for the surgery.  Patient has been NPO since last night and she will remain NPO for procedure.  Anesthesia and OR aware.  Preoperative prophylactic SCDs ordered on call to the OR.  To OR when ready.    GLORIS HUGGER, MD, FACOG Obstetrician & Gynecologist, [facility] for [employer], [facility]

## 2023-12-05 NOTE — Op Note (Signed)
 PREOPERATIVE DIAGNOSIS:  Abnormal uterine bleeding, malpositioned IUD which was unable to be removed in office POSTOPERATIVE DIAGNOSIS: The same, polypoid lesions in the endometrium PROCEDURE: Hysteroscopy, Dilation and Curettage,Polypectomy, IUD removal. SURGEON:  Dr. Gloris Hugger   INDICATIONS: 48 y.o. [id number]  here for scheduled surgery for the aforementioned diagnoses.   Risks of surgery were discussed with the patient including but not limited to: bleeding which may require transfusion; infection which may require antibiotics; injury to uterus or surrounding organs; intrauterine scarring which may impair future fertility; need for additional procedures including laparotomy or laparoscopy; and other postoperative/anesthesia complications. Written informed consent was obtained.    FINDINGS:  A 9 week size uterus.  Diffuse proliferative endometrium and some polypoid lesions near left ostium which were resected using Myosure REACH device.  Normal ostia bilaterally.  ANESTHESIA:   General, paracervical block with 30 ml of 0.5% Marcaine  FLUID DEFICITS:  725 ml of NS ESTIMATED BLOOD LOSS:  30 ml SPECIMENS: Endometrial curettings sent to pathology COMPLICATIONS:  None immediate.  PROCEDURE DETAILS:  The patient was then taken to the operating room where general anesthesia was administered and was found to be adequate.  After an adequate timeout was performed, she was placed in the dorsal lithotomy position and examined; then prepped and draped in the sterile manner.   Her bladder was catheterized for an unmeasured amount of clear, yellow urine. A speculum was then placed in the patient's vagina and a single tooth tenaculum was applied to the anterior lip of the cervix.  The IUD was able to be easily removed at this point from the cervix/lower uterine segment using forceps.   A paracervical block using 30 ml of 0.5% Marcaine  was then administered.  The uterus was sounded to 9 cm and the cervix was  dilated manually with metal dilators to accommodate the 5 mm diagnostic hysteroscope.  The hysteroscope was then inserted under direct visualization using NS as a suspension medium.  The uterine cavity was carefully examined with the findings as noted above.   After further careful visualization of the uterine cavity, the Myosure REACH device was inserted remove the polypoid tissue, also used to do guided curettage of the endometrium.  The hysteroscope equipment was then removed from the uterus. A sharp curettage was then performed to obtain a moderate amount of endometrial curettings.  The tenaculum was removed from the anterior lip of the cervix and the vaginal speculum was removed after noting good hemostasis.  The patient tolerated the procedure well and was taken to the recovery area awake, extubated and in stable condition.  The patient will be discharged to home as per PACU criteria.  Routine postoperative instructions given.  She was prescribed Oxycodone  but given instructions on how to use home supplies of Acetaminophen  and Naproxen . She also wanted to start OCPs for contraception; Yaz was prescribed.  Advised to use backup for the first cycle. She will follow up in the office in 2-3 weeks for postoperative evaluation and OCP check.   GLORIS HUGGER, MD, FACOG Obstetrician & Gynecologist, [facility] for [employer], [facility]

## 2023-12-05 NOTE — Anesthesia Preprocedure Evaluation (Signed)
 Anesthesia Evaluation  Patient identified by MRN, date of birth, ID band Patient awake    Reviewed: Allergy & Precautions, H&P , NPO status , Patient's Chart, lab work & pertinent test results  Airway Mallampati: II  TM Distance: >3 FB Neck ROM: Full    Dental  (+) Dental Advisory Given   Pulmonary neg pulmonary ROS   Pulmonary exam normal breath sounds clear to auscultation       Cardiovascular negative cardio ROS Normal cardiovascular exam Rhythm:Regular Rate:Normal     Neuro/Psych negative neurological ROS  negative psych ROS   GI/Hepatic negative GI ROS, Neg liver ROS,,,  Endo/Other  negative endocrine ROS    Renal/GU negative Renal ROS  negative genitourinary   Musculoskeletal negative musculoskeletal ROS (+)    Abdominal   Peds negative pediatric ROS (+)  Hematology negative hematology ROS (+)   Anesthesia Other Findings Abnormal uterine bleeding  Reproductive/Obstetrics negative OB ROS                              Anesthesia Physical Anesthesia Plan  ASA: 2  Anesthesia Plan: General   Post-op Pain Management: Gabapentin  PO (pre-op)*, Tylenol  PO (pre-op)* and Celebrex  PO (pre-op)*   Induction: Intravenous  PONV Risk Score and Plan: 3 and Ondansetron , Dexamethasone , Midazolam  and Treatment may vary due to age or medical condition  Airway Management Planned: LMA  Additional Equipment:   Intra-op Plan:   Post-operative Plan: Extubation in OR  Informed Consent: I have reviewed the patients History and Physical, chart, labs and discussed the procedure including the risks, benefits and alternatives for the proposed anesthesia with the patient or authorized representative who has indicated his/her understanding and acceptance.     Dental advisory given  Plan Discussed with: CRNA  Anesthesia Plan Comments:         Anesthesia Quick Evaluation

## 2023-12-06 ENCOUNTER — Other Ambulatory Visit: Payer: Self-pay | Admitting: Nurse Practitioner

## 2023-12-06 ENCOUNTER — Encounter (HOSPITAL_COMMUNITY): Payer: Self-pay | Admitting: Obstetrics & Gynecology

## 2023-12-06 DIAGNOSIS — Z1231 Encounter for screening mammogram for malignant neoplasm of breast: Secondary | ICD-10-CM

## 2023-12-06 LAB — SURGICAL PATHOLOGY

## 2023-12-07 ENCOUNTER — Ambulatory Visit: Payer: Self-pay | Admitting: Obstetrics & Gynecology

## 2023-12-08 NOTE — Telephone Encounter (Signed)
 TC to pt regarding recent results Pt voiced understanding and made aware if any abnormal bleeding again to contact the office.

## 2023-12-12 ENCOUNTER — Ambulatory Visit
Admission: RE | Admit: 2023-12-12 | Discharge: 2023-12-12 | Disposition: A | Discharge status: Home / Self Care | Source: Ambulatory Visit | Attending: Nurse Practitioner | Admitting: Nurse Practitioner

## 2023-12-12 ENCOUNTER — Encounter: Payer: Self-pay | Admitting: Radiology

## 2023-12-12 DIAGNOSIS — Z1231 Encounter for screening mammogram for malignant neoplasm of breast: Secondary | ICD-10-CM | POA: Insufficient documentation

## 2024-01-01 ENCOUNTER — Ambulatory Visit (INDEPENDENT_AMBULATORY_CARE_PROVIDER_SITE_OTHER): Admitting: Obstetrics & Gynecology

## 2024-01-01 ENCOUNTER — Encounter: Payer: Self-pay | Admitting: Obstetrics & Gynecology

## 2024-01-01 VITALS — BP 114/71 | HR 84 | Ht 63.0 in | Wt 119.8 lb

## 2024-01-01 DIAGNOSIS — Z30016 Encounter for initial prescription of transdermal patch hormonal contraceptive device: Secondary | ICD-10-CM | POA: Diagnosis not present

## 2024-01-01 DIAGNOSIS — Z09 Encounter for follow-up examination after completed treatment for conditions other than malignant neoplasm: Secondary | ICD-10-CM

## 2024-01-01 MED ORDER — TWIRLA 120-30 MCG/24HR TD PTWK: 1.0000 | MEDICATED_PATCH | TRANSDERMAL | 3 refills | Status: DC

## 2024-01-01 NOTE — Progress Notes (Signed)
 Medication Samples have been provided to the patient.  Drug name: twirla       Strength: 120mcg        Qty: 3 packs  LOT: [id number]  Exp.Date: 02/15/24  Dosing instructions: As directed  The patient has been instructed regarding the correct time, dose, and frequency of taking this medication, including desired effects and most common side effects.   Sharene CHRISTELLA Butter 3:13 PM 01/01/2024

## 2024-01-01 NOTE — Progress Notes (Signed)
 GYNECOLOGY OFFICE VISIT NOTE  History:  Wendy [name] is a 48 y.o. [id number] here today for follow up after hysteroscopy, removal of IUD on 12/05/23.  Doing well, no concerns. Wants to discuss other contraception options. She denies any abnormal vaginal discharge, bleeding, pelvic pain or other concerns.  Past Medical History:  Diagnosis Date   Anemia    Fibroid    Gestational diabetes    Type 2 diabetes mellitus (HCC)     Past Surgical History:  Procedure Laterality Date   CESAREAN SECTION     CESAREAN SECTION N/A 06/04/2020   Procedure: CESAREAN SECTION;  Surgeon: Ozan, Jennifer, DO;  Location: [facility] LD ORS;  Service: Obstetrics;  Laterality: N/A;   EYE SURGERY Bilateral    HYSTEROSCOPY WITH IMPACTED FOREIGN BODY REMOVAL N/A 12/05/2023   Procedure: DILATION AND CURRETAGE/ HYSTEROSCOPY WITH IUD REMOVAL;  Surgeon: Herchel Gloris LABOR, MD;  Location: [facility] OR;  Service: Gynecology;  Laterality: N/A;   MYOMECTOMY ABDOMINAL APPROACH     MYOSURE RESECTION  12/05/2023   Procedure: POLYPECTOMY WITH MYOSURE RESECTION;  Surgeon: Herchel Gloris LABOR, MD;  Location: [facility] OR;  Service: Gynecology;;    The following portions of the patient's history were reviewed and updated as appropriate: allergies, current medications, past family history, past medical history, past social history, past surgical history and problem list.   Health Maintenance:  Normal pap and negative HRHPV on 4//10/2023.  Normal mammogram on 12/12/2023.   Review of Systems:  Pertinent items noted in HPI and remainder of comprehensive ROS otherwise negative.  Physical Exam:  BP 114/71   Pulse 84   Ht 5' 3 (1.6 m)   Wt 119 lb 12.8 oz (54.3 kg)   LMP 12/18/2023 (Approximate)   BMI 21.22 kg/m  CONSTITUTIONAL: Well-developed, well-nourished female in no acute distress.  HEENT:  Normocephalic, atraumatic. External right and left ear normal. No scleral icterus.  NECK: Normal range of motion, supple, no masses noted on observation SKIN: No  rash noted. Not diaphoretic. No erythema. No pallor. MUSCULOSKELETAL: Normal range of motion. No edema noted. NEUROLOGIC: Alert and oriented to person, place, and time. Normal muscle tone coordination. No cranial nerve deficit noted on observation. PSYCHIATRIC: Normal mood and affect. Normal behavior. Normal judgment and thought content. CARDIOVASCULAR: Normal heart rate noted RESPIRATORY: Effort and breath sounds normal, no problems with respiration noted ABDOMEN: No masses or other overt distention noted on observation. No tenderness.   PELVIC: Deferred   Assessment and Plan:     1. Postop check (Primary) Doing well, no concerns  2. Encounter for initial prescription of transdermal patch hormonal contraceptive device Reviewed all forms of birth control options available including abstinence; fertility period awareness methods; over the counter/barrier methods; hormonal contraceptive medication including pill, patch, ring, injection,contraceptive implant; hormonal and nonhormonal IUDs; permanent sterilization options including vasectomy and the various tubal sterilization modalities. Risks and benefits reviewed.  Questions were answered.  Information was given to patient to review.  She wants to try patch. Information about this hormonal modality discussed, all questions answered. Given that her BMI is 21, she is a candidate for [employer].  - Levonorg-Eth Estr, Transderm, (TWIRLA) 120-30 MCG/24HR PTWK; Place 1 patch onto the skin once a week.  Dispense: 4 patch; Refill: 3   Routine preventative health maintenance measures emphasized. Please refer to After Visit Summary for other counseling recommendations.   Return in about 6 weeks (around 02/12/2024) for BP/Patch check.    I spent 30 minutes dedicated to the care of this  patient including pre-visit review of records, face to face time with the patient discussing her conditions and treatments, post visit ordering of medications and appropriate  tests or procedures, coordinating care and documenting this visit encounter.    GLORIS HUGGER, MD, FACOG Obstetrician & Gynecologist, [facility] for [employer], [facility]

## 2024-02-05 DIAGNOSIS — E119 Type 2 diabetes mellitus without complications: Secondary | ICD-10-CM | POA: Diagnosis not present

## 2024-02-13 ENCOUNTER — Ambulatory Visit: Admitting: Obstetrics & Gynecology

## 2024-02-14 DIAGNOSIS — Z130 Encounter for screening for diseases of the blood and blood-forming organs and certain disorders involving the immune mechanism: Secondary | ICD-10-CM | POA: Diagnosis not present

## 2024-02-14 DIAGNOSIS — E119 Type 2 diabetes mellitus without complications: Secondary | ICD-10-CM | POA: Diagnosis not present

## 2024-02-14 DIAGNOSIS — M5442 Lumbago with sciatica, left side: Secondary | ICD-10-CM | POA: Diagnosis not present

## 2024-02-14 DIAGNOSIS — Z13220 Encounter for screening for lipoid disorders: Secondary | ICD-10-CM | POA: Diagnosis not present

## 2024-02-14 DIAGNOSIS — G8929 Other chronic pain: Secondary | ICD-10-CM | POA: Diagnosis not present

## 2024-02-14 DIAGNOSIS — Z09 Encounter for follow-up examination after completed treatment for conditions other than malignant neoplasm: Secondary | ICD-10-CM | POA: Diagnosis not present

## 2024-02-14 DIAGNOSIS — M5441 Lumbago with sciatica, right side: Secondary | ICD-10-CM | POA: Diagnosis not present

## 2024-02-14 DIAGNOSIS — Z1329 Encounter for screening for other suspected endocrine disorder: Secondary | ICD-10-CM | POA: Diagnosis not present

## 2024-03-19 ENCOUNTER — Ambulatory Visit (INDEPENDENT_AMBULATORY_CARE_PROVIDER_SITE_OTHER): Admitting: Obstetrics & Gynecology

## 2024-03-19 ENCOUNTER — Encounter: Payer: Self-pay | Admitting: Obstetrics & Gynecology

## 2024-03-19 VITALS — BP 110/71 | HR 94 | Wt 118.0 lb

## 2024-03-19 DIAGNOSIS — Z30011 Encounter for initial prescription of contraceptive pills: Secondary | ICD-10-CM

## 2024-03-19 MED ORDER — LO LOESTRIN FE 1 MG-10 MCG / 10 MCG PO TABS: 1.0000 | ORAL_TABLET | Freq: Every day | ORAL | 11 refills | Status: AC

## 2024-03-19 NOTE — Progress Notes (Signed)
" ° °  GYNECOLOGY OFFICE VISIT NOTE  History:  Wendy [name] is a 49 y.o. [id number] here today for patch/BP check.  Twirla  was prescribed during 01/01/24 visit.  She reports being very moody on this, also it was expensive when she tried to fill it. Wants cheaper alternative.  She denies any abnormal vaginal discharge, bleeding, pelvic pain or other concerns.  Past Medical History:  Diagnosis Date   Anemia    Fibroid    Gestational diabetes    Type 2 diabetes mellitus (HCC)     Past Surgical History:  Procedure Laterality Date   CESAREAN SECTION     CESAREAN SECTION N/A 06/04/2020   Procedure: CESAREAN SECTION;  Surgeon: Ozan, Jennifer, DO;  Location: [facility] LD [facility]S;  Service: Obstetrics;  Laterality: N/A;   EYE SURGERY Bilateral    HYSTEROSCOPY WITH IMPACTED FOREIGN BODY REMOVAL N/A 12/05/2023   Procedure: DILATION AND CURRETAGE/ HYSTEROSCOPY WITH IUD REMOVAL;  Surgeon: Herchel Gloris LABOR, MD;  Location: [facility] OR;  Service: Gynecology;  Laterality: N/A;   MYOMECTOMY ABDOMINAL APPROACH     MYOSURE RESECTION  12/05/2023   Procedure: POLYPECTOMY WITH MYOSURE RESECTION;  Surgeon: Herchel Gloris LABOR, MD;  Location: [facility] OR;  Service: Gynecology;;    The following portions of the patient's history were reviewed and updated as appropriate: allergies, current medications, past family history, past medical history, past social history, past surgical history and problem list.   Health Maintenance:  Normal pap and negative HRHPV on 4//10/2023.  Normal mammogram on 12/12/2023.   Review of Systems:  Pertinent items noted in HPI and remainder of comprehensive ROS otherwise negative.  Physical Exam:  BP 110/71   Pulse 94   Wt 118 lb (53.5 kg)   LMP 03/04/2024 (Approximate)   BMI 20.90 kg/m  CONSTITUTIONAL: Well-developed, well-nourished female in no acute distress.  HEENT:  Normocephalic, atraumatic. External right and left ear normal. No scleral icterus.  NECK: Normal range of motion, supple, no masses noted on  observation SKIN: No rash noted. Not diaphoretic. No erythema. No pallor. MUSCULOSKELETAL: Normal range of motion. No edema noted. NEUROLOGIC: Alert and oriented to person, place, and time. Normal muscle tone coordination. No cranial nerve deficit noted on observation. PSYCHIATRIC: Normal mood and affect. Normal behavior. Normal judgment and thought content. CARDIOVASCULAR: Normal heart rate noted RESPIRATORY: Effort and breath sounds normal, no problems with respiration noted ABDOMEN: No masses or other overt distention noted on observation. No tenderness.   PELVIC: Deferred  Assessment and Plan:     1. Encounter for initial prescription of contraceptive pills (Primary) Switched to Lo Loestrin Fe , will monitor effect.  Risks of OCPs reviewed with patient, discussed when she needs back up contraception.  She says if this does not work, she will consider sterilization. - LO LOESTRIN FE  1 MG-10 MCG / 10 MCG tablet; Take 1 tablet by mouth daily.  Dispense: 28 tablet; Refill: 11  Routine preventative health maintenance measures emphasized. Please refer to After Visit Summary for other counseling recommendations.   Return for follow up as recommended or as needed.    I spent 25 minutes dedicated to the care of this patient including pre-visit review of records, face to face time with the patient discussing her conditions and treatments, post visit ordering of medications and appropriate tests or procedures, coordinating care and documenting this visit encounter.    GLORIS HERCHEL, MD, FACOG Obstetrician & Gynecologist, [facility] for [employer], [facility] "
